# Patient Record
Sex: Male | Born: 1950 | Race: White | Hispanic: No | Marital: Married | Smoking: Former smoker
Health system: Southern US, Community
[De-identification: ages and names within clinical notes are randomized; demographics above are authoritative.]

## PROBLEM LIST (undated history)

## (undated) DIAGNOSIS — K219 Gastro-esophageal reflux disease without esophagitis: Secondary | ICD-10-CM

## (undated) DIAGNOSIS — J309 Allergic rhinitis, unspecified: Secondary | ICD-10-CM

## (undated) DIAGNOSIS — T8859XA Other complications of anesthesia, initial encounter: Secondary | ICD-10-CM

## (undated) DIAGNOSIS — E538 Deficiency of other specified B group vitamins: Secondary | ICD-10-CM

## (undated) DIAGNOSIS — I10 Essential (primary) hypertension: Secondary | ICD-10-CM

## (undated) DIAGNOSIS — M1990 Unspecified osteoarthritis, unspecified site: Secondary | ICD-10-CM

## (undated) DIAGNOSIS — C801 Malignant (primary) neoplasm, unspecified: Secondary | ICD-10-CM

## (undated) DIAGNOSIS — Z8719 Personal history of other diseases of the digestive system: Secondary | ICD-10-CM

## (undated) DIAGNOSIS — E785 Hyperlipidemia, unspecified: Secondary | ICD-10-CM

## (undated) DIAGNOSIS — T4145XA Adverse effect of unspecified anesthetic, initial encounter: Secondary | ICD-10-CM

## (undated) HISTORY — PX: COLONOSCOPY: SHX174

---
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
## 2006-08-30 ENCOUNTER — Ambulatory Visit: Payer: Self-pay | Admitting: Gastroenterology

## 2012-04-10 ENCOUNTER — Encounter: Payer: Self-pay | Admitting: Podiatry

## 2012-05-04 ENCOUNTER — Encounter: Payer: Self-pay | Admitting: Podiatry

## 2016-07-15 DIAGNOSIS — J111 Influenza due to unidentified influenza virus with other respiratory manifestations: Secondary | ICD-10-CM | POA: Diagnosis not present

## 2016-07-18 DIAGNOSIS — J40 Bronchitis, not specified as acute or chronic: Secondary | ICD-10-CM | POA: Diagnosis not present

## 2016-07-18 DIAGNOSIS — J0100 Acute maxillary sinusitis, unspecified: Secondary | ICD-10-CM | POA: Diagnosis not present

## 2016-10-08 DIAGNOSIS — Z0000 Encounter for general adult medical examination without abnormal findings: Secondary | ICD-10-CM | POA: Diagnosis not present

## 2016-10-12 DIAGNOSIS — C44519 Basal cell carcinoma of skin of other part of trunk: Secondary | ICD-10-CM | POA: Diagnosis not present

## 2016-10-12 DIAGNOSIS — Z85828 Personal history of other malignant neoplasm of skin: Secondary | ICD-10-CM | POA: Diagnosis not present

## 2016-10-12 DIAGNOSIS — L570 Actinic keratosis: Secondary | ICD-10-CM | POA: Diagnosis not present

## 2016-10-12 DIAGNOSIS — I788 Other diseases of capillaries: Secondary | ICD-10-CM | POA: Diagnosis not present

## 2016-10-12 DIAGNOSIS — L739 Follicular disorder, unspecified: Secondary | ICD-10-CM | POA: Diagnosis not present

## 2016-10-15 DIAGNOSIS — Z0000 Encounter for general adult medical examination without abnormal findings: Secondary | ICD-10-CM | POA: Diagnosis not present

## 2016-10-15 DIAGNOSIS — E782 Mixed hyperlipidemia: Secondary | ICD-10-CM | POA: Diagnosis not present

## 2016-10-15 DIAGNOSIS — E538 Deficiency of other specified B group vitamins: Secondary | ICD-10-CM | POA: Diagnosis not present

## 2016-10-15 DIAGNOSIS — Z125 Encounter for screening for malignant neoplasm of prostate: Secondary | ICD-10-CM | POA: Diagnosis not present

## 2016-10-15 DIAGNOSIS — M76899 Other specified enthesopathies of unspecified lower limb, excluding foot: Secondary | ICD-10-CM | POA: Diagnosis not present

## 2016-10-15 DIAGNOSIS — Z79899 Other long term (current) drug therapy: Secondary | ICD-10-CM | POA: Diagnosis not present

## 2016-11-05 DIAGNOSIS — Y92009 Unspecified place in unspecified non-institutional (private) residence as the place of occurrence of the external cause: Secondary | ICD-10-CM | POA: Diagnosis not present

## 2016-11-05 DIAGNOSIS — S61411A Laceration without foreign body of right hand, initial encounter: Secondary | ICD-10-CM | POA: Diagnosis not present

## 2016-11-05 DIAGNOSIS — X58XXXA Exposure to other specified factors, initial encounter: Secondary | ICD-10-CM | POA: Diagnosis not present

## 2016-11-05 DIAGNOSIS — S6981XA Other specified injuries of right wrist, hand and finger(s), initial encounter: Secondary | ICD-10-CM | POA: Diagnosis not present

## 2016-11-13 DIAGNOSIS — M25561 Pain in right knee: Secondary | ICD-10-CM | POA: Diagnosis not present

## 2016-11-13 DIAGNOSIS — M1711 Unilateral primary osteoarthritis, right knee: Secondary | ICD-10-CM | POA: Diagnosis not present

## 2016-11-15 DIAGNOSIS — I10 Essential (primary) hypertension: Secondary | ICD-10-CM | POA: Diagnosis not present

## 2016-11-15 DIAGNOSIS — T783XXA Angioneurotic edema, initial encounter: Secondary | ICD-10-CM | POA: Diagnosis not present

## 2016-11-18 ENCOUNTER — Observation Stay
Admission: EM | Admit: 2016-11-18 | Discharge: 2016-11-19 | Disposition: A | Discharge status: Home / Self Care | Payer: PPO | Attending: Specialist | Admitting: Specialist

## 2016-11-18 ENCOUNTER — Emergency Department: Payer: PPO

## 2016-11-18 DIAGNOSIS — E871 Hypo-osmolality and hyponatremia: Secondary | ICD-10-CM | POA: Diagnosis not present

## 2016-11-18 DIAGNOSIS — Z888 Allergy status to other drugs, medicaments and biological substances status: Secondary | ICD-10-CM | POA: Diagnosis not present

## 2016-11-18 DIAGNOSIS — R079 Chest pain, unspecified: Principal | ICD-10-CM | POA: Insufficient documentation

## 2016-11-18 DIAGNOSIS — Z79899 Other long term (current) drug therapy: Secondary | ICD-10-CM | POA: Diagnosis not present

## 2016-11-18 DIAGNOSIS — I10 Essential (primary) hypertension: Secondary | ICD-10-CM | POA: Insufficient documentation

## 2016-11-18 DIAGNOSIS — Z880 Allergy status to penicillin: Secondary | ICD-10-CM | POA: Diagnosis not present

## 2016-11-18 DIAGNOSIS — M79602 Pain in left arm: Secondary | ICD-10-CM | POA: Insufficient documentation

## 2016-11-18 DIAGNOSIS — E785 Hyperlipidemia, unspecified: Secondary | ICD-10-CM | POA: Diagnosis not present

## 2016-11-18 DIAGNOSIS — R0789 Other chest pain: Secondary | ICD-10-CM

## 2016-11-18 DIAGNOSIS — I200 Unstable angina: Secondary | ICD-10-CM | POA: Diagnosis not present

## 2016-11-18 DIAGNOSIS — R030 Elevated blood-pressure reading, without diagnosis of hypertension: Secondary | ICD-10-CM | POA: Diagnosis not present

## 2016-11-18 DIAGNOSIS — K219 Gastro-esophageal reflux disease without esophagitis: Secondary | ICD-10-CM | POA: Diagnosis not present

## 2016-11-18 HISTORY — DX: Hyperlipidemia, unspecified: E78.5

## 2016-11-18 HISTORY — DX: Essential (primary) hypertension: I10

## 2016-11-18 LAB — BASIC METABOLIC PANEL
ANION GAP: 7 (ref 5–15)
BUN: 14 mg/dL (ref 6–20)
CALCIUM: 9.5 mg/dL (ref 8.9–10.3)
CO2: 24 mmol/L (ref 22–32)
Chloride: 101 mmol/L (ref 101–111)
Creatinine, Ser: 0.76 mg/dL (ref 0.61–1.24)
GFR calc Af Amer: >60 mL/min (ref >60)
GLUCOSE: 120 mg/dL — AB (ref 65–99)
POTASSIUM: 3.8 mmol/L (ref 3.5–5.1)
SODIUM: 132 mmol/L — AB (ref 135–145)

## 2016-11-18 LAB — HEPARIN LEVEL (UNFRACTIONATED): HEPARIN UNFRACTIONATED: 0.35 [IU]/mL (ref 0.30–0.70)

## 2016-11-18 LAB — PROTIME-INR
INR: 1
PROTHROMBIN TIME: 13.2 s (ref 11.4–15.2)

## 2016-11-18 LAB — CBC WITH DIFFERENTIAL/PLATELET
BASOS ABS: 0 10*3/uL (ref 0–0.1)
Basophils Relative: 1 %
EOS PCT: 2 %
Eosinophils Absolute: 0.1 10*3/uL (ref 0–0.7)
HCT: 41 % (ref 40.0–52.0)
Hemoglobin: 14 g/dL (ref 13.0–18.0)
Lymphocytes Relative: 26 %
Lymphs Abs: 1.2 10*3/uL (ref 1.0–3.6)
MCH: 31 pg (ref 26.0–34.0)
MCHC: 34.2 g/dL (ref 32.0–36.0)
MCV: 90.6 fL (ref 80.0–100.0)
MONO ABS: 0.6 10*3/uL (ref 0.2–1.0)
Monocytes Relative: 13 %
Neutro Abs: 2.7 10*3/uL (ref 1.4–6.5)
Neutrophils Relative %: 58 %
PLATELETS: 265 10*3/uL (ref 150–440)
RBC: 4.53 MIL/uL (ref 4.40–5.90)
RDW: 13.3 % (ref 11.5–14.5)
WBC: 4.6 10*3/uL (ref 3.8–10.6)

## 2016-11-18 LAB — APTT: aPTT: 26 seconds (ref 24–36)

## 2016-11-18 LAB — TROPONIN I

## 2016-11-18 MED ORDER — ONDANSETRON HCL 4 MG/2ML IJ SOLN: 4.0000 mg | Freq: Four times a day (QID) | INTRAMUSCULAR | Status: DC | PRN

## 2016-11-18 MED ORDER — SIMVASTATIN 20 MG PO TABS: 40.0000 mg | ORAL_TABLET | Freq: Every day | ORAL | Status: DC

## 2016-11-18 MED ORDER — ACETAMINOPHEN 325 MG PO TABS
650.0000 mg | ORAL_TABLET | ORAL | Status: DC | PRN
  Administered 2016-11-18 (×2): 650 mg via ORAL
  Filled 2016-11-18 (×2): qty 2

## 2016-11-18 MED ORDER — METOPROLOL TARTRATE 25 MG PO TABS
25.0000 mg | ORAL_TABLET | Freq: Once | ORAL | Status: AC
  Administered 2016-11-18: 25 mg via ORAL
  Filled 2016-11-18: qty 1

## 2016-11-18 MED ORDER — ASPIRIN 325 MG PO TABS
325.0000 mg | ORAL_TABLET | Freq: Every day | ORAL | Status: DC
  Filled 2016-11-18: qty 1

## 2016-11-18 MED ORDER — PANTOPRAZOLE SODIUM 40 MG PO TBEC
40.0000 mg | DELAYED_RELEASE_TABLET | Freq: Every day | ORAL | Status: DC
  Administered 2016-11-19: 40 mg via ORAL
  Filled 2016-11-18: qty 1

## 2016-11-18 MED ORDER — AMLODIPINE BESYLATE 5 MG PO TABS
5.0000 mg | ORAL_TABLET | Freq: Once | ORAL | Status: AC
  Administered 2016-11-18: 5 mg via ORAL
  Filled 2016-11-18: qty 1

## 2016-11-18 MED ORDER — NITROGLYCERIN 2 % TD OINT
1.0000 [in_us] | TOPICAL_OINTMENT | Freq: Once | TRANSDERMAL | Status: AC
  Administered 2016-11-18: 1 [in_us] via TOPICAL
  Filled 2016-11-18: qty 1

## 2016-11-18 MED ORDER — NITROGLYCERIN 0.4 MG SL SUBL
0.4000 mg | SUBLINGUAL_TABLET | SUBLINGUAL | Status: DC | PRN
  Administered 2016-11-18: 0.4 mg via SUBLINGUAL
  Filled 2016-11-18: qty 1

## 2016-11-18 MED ORDER — SODIUM CHLORIDE 0.9 % IV SOLN
INTRAVENOUS | Status: AC
  Administered 2016-11-18 – 2016-11-19 (×2): via INTRAVENOUS

## 2016-11-18 MED ORDER — HEPARIN (PORCINE) IN NACL 100-0.45 UNIT/ML-% IJ SOLN
1000.0000 [IU]/h | INTRAMUSCULAR | Status: DC
  Administered 2016-11-18: 1000 [IU]/h via INTRAVENOUS
  Filled 2016-11-18 (×2): qty 250

## 2016-11-18 MED ORDER — DOXAZOSIN MESYLATE 2 MG PO TABS
2.0000 mg | ORAL_TABLET | Freq: Every day | ORAL | Status: DC
  Filled 2016-11-18: qty 1

## 2016-11-18 MED ORDER — MORPHINE SULFATE (PF) 4 MG/ML IV SOLN
4.0000 mg | INTRAVENOUS | Status: DC | PRN
  Administered 2016-11-18: 4 mg via INTRAVENOUS
  Filled 2016-11-18: qty 1

## 2016-11-18 MED ORDER — AMLODIPINE BESYLATE 5 MG PO TABS: 5.0000 mg | ORAL_TABLET | Freq: Every day | ORAL | Status: DC

## 2016-11-18 MED ORDER — HEPARIN BOLUS VIA INFUSION
4000.0000 [IU] | Freq: Once | INTRAVENOUS | Status: AC
  Administered 2016-11-18: 4000 [IU] via INTRAVENOUS
  Filled 2016-11-18: qty 4000

## 2016-11-18 MED ORDER — ACETAMINOPHEN 325 MG PO TABS
650.0000 mg | ORAL_TABLET | Freq: Once | ORAL | Status: AC
  Administered 2016-11-18: 650 mg via ORAL
  Filled 2016-11-18: qty 2

## 2016-11-18 MED ORDER — METOPROLOL TARTRATE 25 MG PO TABS
12.5000 mg | ORAL_TABLET | Freq: Two times a day (BID) | ORAL | Status: DC
  Administered 2016-11-18: 12.5 mg via ORAL
  Filled 2016-11-18: qty 1

## 2016-11-18 MED ORDER — ASPIRIN 81 MG PO CHEW
324.0000 mg | CHEWABLE_TABLET | Freq: Once | ORAL | Status: AC
  Administered 2016-11-18: 324 mg via ORAL
  Filled 2016-11-18: qty 4

## 2016-11-18 NOTE — ED Provider Notes (Signed)
[facility] Emergency Department Provider Note    None    (approximate)  I have reviewed the triage vital signs and the nursing notes.   HISTORY  Chief Complaint Elevated BP, left arm discomfort   HPI Bryan Santiago is a 66 y.o. male with a history of hyperlipidemia and hypertension presents with left arm discomfort and flushing sensation on his neck. Patient woke up this morning and checked his blood pressure which was elevated significantly symptoms are related to elevated blood pressure. He denies any chest pain or shortness of breath. No lower extremity swelling. No diaphoresis. No nausea. Denies any true numbness or tingling with states that he does have this heavy feeling in his left arm that does not change with exertion or position area and no recent fevers. Did recently have a small case of angioedema for which they took him off of his benazapril.  Was started on doxazosin and took it for the first time last night. Woke up with these sensations this morning. States is also gone under quite a bit of stress at home and at work.   Past Medical History:  Diagnosis Date  . Hyperlipemia   . Hypertension    [place]: cardiac disease PSH:no recent surgeries There are no active problems to display for this patient.     Prior to Admission medications   Not on File    Allergies Ace inhibitors; Penicillins; and Quinolones    Social History Social History  Substance Use Topics  . Smoking status: Not on file  . Smokeless tobacco: Not on file  . Alcohol use Not on file    Review of Systems Patient denies headaches, rhinorrhea, blurry vision, numbness, shortness of breath, chest pain, edema, cough, abdominal pain, nausea, vomiting, diarrhea, dysuria, fevers, rashes or hallucinations unless otherwise stated above in HPI. ____________________________________________   PHYSICAL EXAM:  VITAL SIGNS: Vitals:   11/18/16 1130 11/18/16 1140  BP: (!)  173/107 (!) 153/97  Pulse: 92 (!) 110  Resp: 18 19  Temp:      Constitutional: Alert and oriented. Well appearing and in no acute distress. Eyes: Conjunctivae are normal.  Head: Atraumatic. Nose: No congestion/rhinnorhea. Mouth/Throat: Mucous membranes are moist.   Neck: No stridor. Painless ROM.  Cardiovascular: Normal rate, regular rhythm. Grossly normal heart sounds.  Good peripheral circulation. Respiratory: Normal respiratory effort.  No retractions. Lungs CTAB. Gastrointestinal: Soft and nontender. No distention. No abdominal bruits. No CVA tenderness. Musculoskeletal: No lower extremity tenderness nor edema.  No joint effusions. Neurologic:  Normal speech and language. No gross focal neurologic deficits are appreciated. No facial droop Skin:  Skin is warm, dry and intact. No rash noted. Psychiatric: Mood and affect are normal. Speech and behavior are normal.  ____________________________________________   LABS (all labs ordered are listed, but only abnormal results are displayed)  Results for orders placed or performed during the hospital encounter of 11/18/16 (from the past 24 hour(s))  CBC with Differential/Platelet     Status: None   Collection Time: 11/18/16 10:41 AM  Result Value Ref Range   WBC 4.6 3.8 - 10.6 K/uL   RBC 4.53 4.40 - 5.90 MIL/uL   Hemoglobin 14.0 13.0 - 18.0 g/dL   HCT 41.0 40.0 - 52.0 %   MCV 90.6 80.0 - 100.0 fL   MCH 31.0 26.0 - 34.0 pg   MCHC 34.2 32.0 - 36.0 g/dL   RDW 13.3 11.5 - 14.5 %   Platelets 265 150 - 440 K/uL  Neutrophils Relative % 58 %   Neutro Abs 2.7 1.4 - 6.5 K/uL   Lymphocytes Relative 26 %   Lymphs Abs 1.2 1.0 - 3.6 K/uL   Monocytes Relative 13 %   Monocytes Absolute 0.6 0.2 - 1.0 K/uL   Eosinophils Relative 2 %   Eosinophils Absolute 0.1 0 - 0.7 K/uL   Basophils Relative 1 %   Basophils Absolute 0.0 0 - 0.1 K/uL  Basic metabolic panel     Status: Abnormal   Collection Time: 11/18/16 10:41 AM  Result Value Ref Range     Sodium 132 (L) 135 - 145 mmol/L   Potassium 3.8 3.5 - 5.1 mmol/L   Chloride 101 101 - 111 mmol/L   CO2 24 22 - 32 mmol/L   Glucose, Bld 120 (H) 65 - 99 mg/dL   BUN 14 6 - 20 mg/dL   Creatinine, Ser 0.76 0.61 - 1.24 mg/dL   Calcium 9.5 8.9 - 10.3 mg/dL   GFR calc non Af Amer >60 >60 mL/min   GFR calc Af Amer >60 >60 mL/min   Anion gap 7 5 - 15  Troponin I     Status: None   Collection Time: 11/18/16 10:41 AM  Result Value Ref Range   Troponin I <0.03 <0.03 ng/mL   ____________________________________________  EKG My review and personal interpretation at Time: 10:32   Indication: htn  Rate: 85  Rhythm: sinus Axis: normal Other: < 48mm st depressions, normal intervals ____________________________________________  RADIOLOGY  I personally reviewed all radiographic images ordered to evaluate for the above acute complaints and reviewed radiology reports and findings.  These findings were personally discussed with the patient.  Please see medical record for radiology report.  ____________________________________________   PROCEDURES  Procedure(s) performed:  Procedures    Critical Care performed:yes CRITICAL CARE Performed by: Merlyn Lot   Total critical care time: 30 minutes  Critical care time was exclusive of separately billable procedures and treating other patients.  Critical care was necessary to treat or prevent imminent or life-threatening deterioration.  Critical care was time spent personally by me on the following activities: development of treatment plan with patient and/or surrogate as well as nursing, discussions with consultants, evaluation of patient's response to treatment, examination of patient, obtaining history from patient or surrogate, ordering and performing treatments and interventions, ordering and review of laboratory studies, ordering and review of radiographic studies, pulse oximetry and re-evaluation of patient's  condition.  ____________________________________________   INITIAL IMPRESSION / ASSESSMENT AND PLAN / ED COURSE  Pertinent labs & imaging results that were available during my care of the patient were reviewed by me and considered in my medical decision making (see chart for details).  DDX: ACS, htn emergency, htnive urgency, dissection, pna, bronchitis, stress   Bryan Santiago is a 66 y.o. who presents to the ED with Left arm and neck pain as described above. Patient hypertensive in the ER but fairly well-appearing. EKG shows nonspecific but concerning ST depressions in anteroseptal distribution. ABR is also mildly elevated as compared to V1. Do not feel this meets STEMI criteria but is concerning given his symptoms. He has a heart score of 4. He has not had any previous cardiac workup. Will evaluate for any evidence of hypertensive urgency. Seems less consistent with dissection and physical pulses throughout and chest x-ray shows no mediastinal widening.  The patient will be placed on continuous pulse oximetry and telemetry for monitoring.  Laboratory evaluation will be sent to evaluate for the  above complaints.     Clinical Course as of Nov 18 1212  Sun Nov 18, 2016  1204 Patient reassessed. States he did have improvement in his neck pain with nitroglycerin. This is also having some persistent left arm pain. Initial troponin is negative. No evidence of mediastinal widening to suggest dissection.  [PR]    Clinical Course User Index [PR] Merlyn Lot, MD    Discussion with the hospitalist and cardiology will continue patient on heparin for concern for unstable angina.  Have discussed with the patient and available family all diagnostics and treatments performed thus far and all questions were answered to the best of my ability. The patient demonstrates understanding and agreement with plan.  ____________________________________________   FINAL CLINICAL IMPRESSION(S) / ED  DIAGNOSES  Final diagnoses:  Atypical chest pain  Arm pain, central, left      NEW MEDICATIONS STARTED DURING THIS VISIT:  New Prescriptions   No medications on file     Note:  This document was prepared using Dragon voice recognition software and may include unintentional dictation errors.    Merlyn Lot, MD 11/18/16 1318

## 2016-11-18 NOTE — Progress Notes (Signed)
ANTICOAGULATION CONSULT NOTE - Initial Consult  Pharmacy Consult for Heparin Drip  Indication: chest pain/ACS  Allergies  Allergen Reactions  . Ace Inhibitors Swelling    angioedema  . Mucinex [Guaifenesin Er]     Facial swelling   . Penicillins Swelling    Has patient had a PCN reaction causing immediate rash, facial/tongue/throat swelling, SOB or lightheadedness with hypotension: Yes Has patient had a PCN reaction causing severe rash involving mucus membranes or skin necrosis: No Has patient had a PCN reaction that required hospitalization: No Has patient had a PCN reaction occurring within the last 10 years: Yes If all of the above answers are "NO", then may proceed with Cephalosporin use.  Happened 06/2016. Facial swelling   . Quinolones     Pt states he doesn't have any recollection of this being an allergy     Patient Measurements: Height: 5\' 10"  (177.8 cm) Weight: 190 lb (86.2 kg) IBW/kg (Calculated) : 73  Vital Signs: Temp: 98 F (36.7 C) (06/17 1028) Temp Source: Oral (06/17 1028) BP: 152/93 (06/17 1230) Pulse Rate: 82 (06/17 1230)  Labs:  Recent Labs  11/18/16 1041  HGB 14.0  HCT 41.0  PLT 265  CREATININE 0.76  TROPONINI <0.03    Estimated Creatinine Clearance: 95.1 mL/min (by C-G formula based on SCr of 0.76 mg/dL).   Medical History: Past Medical History:  Diagnosis Date  . Hyperlipemia   . Hypertension    Assessment: 66 yo male with left arm and jaw pain. Pharmacy consulted for heparin drip dosing and monitoring for ACS.   Goal of Therapy:  Heparin level 0.3-0.7 units/ml Monitor platelets by anticoagulation protocol: Yes   Plan:  Baseline labs ordered Give 4000 units bolus x 1 Start heparin infusion at 1000 units/hr Check anti-Xa level in 6 hours and daily while on heparin Continue to monitor H&H and platelets  Pernell Dupre, PharmD, BCPS Clinical Pharmacist 11/18/2016 12:42 PM

## 2016-11-18 NOTE — Care Management Obs Status (Signed)
[facility] NOTIFICATION   Patient Details  Name: Bryan Santiago MRN: [id number] Date of Birth: [birth year]   Medicare Observation Status Notification Given:  Yes (unstable angina)    Ival Bible, RN 11/18/2016, 3:03 PM

## 2016-11-18 NOTE — H&P (Signed)
[facility] NAME: Bryan Santiago    MR#:  [id number]  DATE OF BIRTH:  [birth year]  DATE OF ADMISSION:  11/18/2016  PRIMARY CARE PHYSICIAN: System, Pcp Not In   REQUESTING/REFERRING PHYSICIAN: Quentin Cornwall  CHIEF COMPLAINT:   Jaw pain and left shoulder pain with elevated blood pressure HISTORY OF PRESENT ILLNESS:  Bryan Santiago  is a 66 y.o. male with a known history ofHypertension and hyperlipidemia is presenting to the ED with a chief complaint of pressure in the neck radiating to the left jaw and also left shoulder pain started at around 9:00 in the morning. Patient came into the ED as the pressure is not getting better. No similar complaints in the past. EKG has revealed ST depressions from v2-v4. Initial troponin is negative. Patient's jaw pain and shoulder pain slightly better after giving sublingual nitroglycerin. Nitropaste attached to the anterior chest wall. Patient is started on heparin drip Chest x-rays negative and the hospitalist team is called to admit the patient  PAST MEDICAL HISTORY:   Past Medical History:  Diagnosis Date  . Hyperlipemia   . Hypertension     PAST SURGICAL HISTOIRY:  No past surgical history on file.  SOCIAL HISTORY:   Social History  Substance Use Topics  . Smoking status: Not on file  . Smokeless tobacco: Not on file  . Alcohol use Not on file    FAMILY HISTORY:  No family history on file.  DRUG ALLERGIES:   Allergies  Allergen Reactions  . Ace Inhibitors Swelling    angioedema  . Mucinex [Guaifenesin Er]     Facial swelling   . Penicillins Swelling    Has patient had a PCN reaction causing immediate rash, facial/tongue/throat swelling, SOB or lightheadedness with hypotension: Yes Has patient had a PCN reaction causing severe rash involving mucus membranes or skin necrosis: No Has patient had a PCN reaction that required hospitalization: No Has patient had a PCN reaction occurring  within the last 10 years: Yes If all of the above answers are "NO", then may proceed with Cephalosporin use.  Happened 06/2016. Facial swelling   . Quinolones     Pt states he doesn't have any recollection of this being an allergy     REVIEW OF SYSTEMS:  CONSTITUTIONAL: No fever, fatigue or weakness.  EYES: No blurred or double vision.  EARS, NOSE, AND THROAT: No tinnitus or ear pain.  RESPIRATORY: No cough, shortness of breath, wheezing or hemoptysis.  CARDIOVASCULAR: Neck pressure and left shoulder pain with jaw pain No chest pain, orthopnea, edema.  GASTROINTESTINAL: No nausea, vomiting, diarrhea or abdominal pain.  GENITOURINARY: No dysuria, hematuria.  ENDOCRINE: No polyuria, nocturia,  HEMATOLOGY: No anemia, easy bruising or bleeding SKIN: No rash or lesion. MUSCULOSKELETAL: No joint pain or arthritis.   NEUROLOGIC: No tingling, numbness, weakness.  PSYCHIATRY: No anxiety or depression.   MEDICATIONS AT HOME:   Prior to Admission medications   Medication Sig Start Date End Date Taking? Authorizing Provider  amLODipine (NORVASC) 5 MG tablet Take 5 mg by mouth daily. 11/15/16  Yes [provider]  doxazosin (CARDURA) 2 MG tablet Take 2 mg by mouth daily. 11/15/16  Yes [provider]  omeprazole (PRILOSEC) 20 MG capsule Take 20 mg by mouth daily. 09/09/16  Yes [provider]  simvastatin (ZOCOR) 40 MG tablet Take 40 mg by mouth daily. 09/12/16  Yes [provider]      VITAL SIGNS:  Blood pressure Marland Kitchen)  152/93, pulse 82, temperature 98 F (36.7 C), temperature source Oral, resp. rate 19, height 5\' 10"  (1.778 m), weight 86.2 kg (190 lb), SpO2 96 %.  PHYSICAL EXAMINATION:  GENERAL:  66 y.o.-year-old patient lying in the bed with no acute distress.  EYES: Pupils equal, round, reactive to light and accommodation. No scleral icterus. Extraocular muscles intact.  HEENT: Head atraumatic, normocephalic. Oropharynx and nasopharynx clear.  NECK:   Supple, no jugular venous distention. No thyroid enlargement, no tenderness.  LUNGS: Normal breath sounds bilaterally, no wheezing, rales,rhonchi or crepitation. No use of accessory muscles of respiration.  CARDIOVASCULAR: S1, S2 normal. No murmurs, rubs, or gallops.  ABDOMEN: Soft, nontender, nondistended. Bowel sounds present. No organomegaly or mass.  EXTREMITIES: No pedal edema, cyanosis, or clubbing.  NEUROLOGIC: Cranial nerves II through XII are intact. Muscle strength 5/5 in all extremities. Sensation intact. Gait not checked.  PSYCHIATRIC: The patient is alert and oriented x 3. Anxious SKIN: No obvious rash, lesion, or ulcer.   LABORATORY PANEL:   CBC  Recent Labs Lab 11/18/16 1041  WBC 4.6  HGB 14.0  HCT 41.0  PLT 265   ------------------------------------------------------------------------------------------------------------------  Chemistries   Recent Labs Lab 11/18/16 1041  NA 132*  K 3.8  CL 101  CO2 24  GLUCOSE 120*  BUN 14  CREATININE 0.76  CALCIUM 9.5   ------------------------------------------------------------------------------------------------------------------  Cardiac Enzymes  Recent Labs Lab 11/18/16 1041  TROPONINI <0.03   ------------------------------------------------------------------------------------------------------------------  RADIOLOGY:  Dg Chest Portable 1 View  Result Date: 11/18/2016 CLINICAL DATA:  Left arm and jaw pain.  Elevated blood pressure. EXAM: PORTABLE CHEST 1 VIEW COMPARISON:  None. FINDINGS: The heart size and mediastinal contours are within normal limits. Both lungs are clear. The visualized skeletal structures are unremarkable. IMPRESSION: No active disease. Electronically Signed   By: Dorise Bullion III M.D   On: 11/18/2016 11:57    EKG:   Orders placed or performed during the hospital encounter of 11/18/16  . EKG 12-Lead  . EKG 12-Lead    IMPRESSION AND PLAN:  Bryan Santiago  is a 66 y.o. male with a  known history ofHypertension and hyperlipidemia is presenting to the ED with a chief complaint of pressure in the neck radiating to the left jaw and also left shoulder pain started at around 9:00 in the morning. Patient came into the ED as the pressure is not getting better. No similar complaints in the past. EKG has revealed ST depressions from v2-v4. Initial troponin is negative.  # Unstable angina Tele heaprin gtt EKG reveals ST depressions from V2-4 Aspirin, nitroglycerin, beta blocker and statin Cardio consult  Will get echocardiogram Nitro paste and anterior chest wall Nothing by mouth after midnight for possible Myoview test if troponins are not trending  #Essential hypertension-blood pressure is elevated Metoprolol is added to the regimen and titrate as needed Nitropaste is attached to the anterior chest wall Continue home medications amlodipine and doxazosin  #Hyperlipidemia Check fasting lipid panel and continue statin  #Hyponatremia IV fluids, mentating fine, recheck labs in a.m.     All the records are reviewed and case discussed with ED provider. Management plans discussed with the patient, family and they are in agreement.  CODE STATUS: fc, wife HCPOA  TOTAL TIME TAKING CARE OF THIS PATIENT: 45 minutes.   Note: This dictation was prepared with Dragon dictation along with smaller phrase technology. Any transcriptional errors that result from this process are unintentional.  Nicholes Mango M.D on 11/18/2016 at 1:19 PM  Between  7am to 6pm - Pager - [phone]  After 6pm go to [email or web address] - password [email or web address] [facility]  Office  [phone]  CC: Primary care physician; System, Pcp Not In

## 2016-11-18 NOTE — Progress Notes (Addendum)
ANTICOAGULATION CONSULT NOTE - Initial Consult  Pharmacy Consult for Heparin Drip  Indication: chest pain/ACS  Allergies  Allergen Reactions  . Ace Inhibitors Swelling    angioedema  . Mucinex [Guaifenesin Er]     Facial swelling   . Penicillins Swelling    Has patient had a PCN reaction causing immediate rash, facial/tongue/throat swelling, SOB or lightheadedness with hypotension: Yes Has patient had a PCN reaction causing severe rash involving mucus membranes or skin necrosis: No Has patient had a PCN reaction that required hospitalization: No Has patient had a PCN reaction occurring within the last 10 years: Yes If all of the above answers are "NO", then may proceed with Cephalosporin use.  Happened 06/2016. Facial swelling   . Quinolones     Pt states he doesn't have any recollection of this being an allergy     Patient Measurements: Height: 5\' 10"  (177.8 cm) Weight: 187 lb 8 oz (85 kg) IBW/kg (Calculated) : 73  Vital Signs: Temp: 97.7 F (36.5 C) (06/17 2005) Temp Source: Oral (06/17 2005) BP: 135/76 (06/17 2005) Pulse Rate: 67 (06/17 2005)  Labs:  Recent Labs  11/18/16 1041 11/18/16 1241 11/18/16 1930  HGB 14.0  --   --   HCT 41.0  --   --   PLT 265  --   --   APTT  --  26  --   LABPROT  --  13.2  --   INR  --  1.00  --   HEPARINUNFRC  --   --  0.35  CREATININE 0.76  --   --   TROPONINI <0.03  --   --     Estimated Creatinine Clearance: 95.1 mL/min (by C-G formula based on SCr of 0.76 mg/dL).   Medical History: Past Medical History:  Diagnosis Date  . Hyperlipemia   . Hypertension    Assessment: 66 yo male with left arm and jaw pain. Pharmacy consulted for heparin drip dosing and monitoring for ACS.   Goal of Therapy:  Heparin level 0.3-0.7 units/ml Monitor platelets by anticoagulation protocol: Yes   Plan:   0617 1930 HL: .0.35. Level is therapeutic. Will continue heparin drip at 1000units/hr and Will redraw level in 6 hours. CBC with AM  labs per protocol.   Pernell Dupre, PharmD, BCPS Clinical Pharmacist 11/18/2016 8:28 PM    6/18 01:30 heparin level 0.33. Continue current regimen. Recheck heparin level and CBC with tomorrow AM labs.  Sim Boast, PharmD, BCPS  11/19/16 2:11 AM

## 2016-11-18 NOTE — ED Triage Notes (Signed)
Pt states they changed his b/p and states he started the new one Friday, states today b/p is elevated 170/100's with left arm and jaw pain.Marland Kitchen

## 2016-11-19 ENCOUNTER — Observation Stay (HOSPITAL_BASED_OUTPATIENT_CLINIC_OR_DEPARTMENT_OTHER)
Admit: 2016-11-19 | Discharge: 2016-11-19 | Disposition: A | Discharge status: Home / Self Care | Payer: PPO | Attending: Internal Medicine | Admitting: Internal Medicine

## 2016-11-19 ENCOUNTER — Observation Stay (HOSPITAL_BASED_OUTPATIENT_CLINIC_OR_DEPARTMENT_OTHER): Payer: PPO

## 2016-11-19 ENCOUNTER — Encounter: Payer: Self-pay | Admitting: Physician Assistant

## 2016-11-19 DIAGNOSIS — I360 Nonrheumatic tricuspid (valve) stenosis: Secondary | ICD-10-CM | POA: Diagnosis not present

## 2016-11-19 DIAGNOSIS — M79602 Pain in left arm: Secondary | ICD-10-CM

## 2016-11-19 DIAGNOSIS — E782 Mixed hyperlipidemia: Secondary | ICD-10-CM | POA: Diagnosis not present

## 2016-11-19 DIAGNOSIS — E785 Hyperlipidemia, unspecified: Secondary | ICD-10-CM | POA: Diagnosis not present

## 2016-11-19 DIAGNOSIS — R079 Chest pain, unspecified: Secondary | ICD-10-CM | POA: Diagnosis not present

## 2016-11-19 DIAGNOSIS — E871 Hypo-osmolality and hyponatremia: Secondary | ICD-10-CM | POA: Diagnosis not present

## 2016-11-19 DIAGNOSIS — I10 Essential (primary) hypertension: Secondary | ICD-10-CM | POA: Diagnosis not present

## 2016-11-19 DIAGNOSIS — I200 Unstable angina: Secondary | ICD-10-CM | POA: Diagnosis not present

## 2016-11-19 LAB — NM MYOCAR MULTI W/SPECT W/WALL MOTION / EF
CSEPEDS: 0 s
CSEPEW: 7 METS
CSEPHR: 87 %
Exercise duration (min): 6 min
LV sys vol: 30 mL
LVDIAVOL: 90 mL (ref 62–150)
Peak HR: 136 {beats}/min
Rest HR: 67 {beats}/min
SDS: 0
SRS: 2
SSS: 4
TID: 0.92

## 2016-11-19 LAB — BASIC METABOLIC PANEL
Anion gap: 5 (ref 5–15)
BUN: 13 mg/dL (ref 6–20)
CALCIUM: 8.9 mg/dL (ref 8.9–10.3)
CO2: 25 mmol/L (ref 22–32)
Chloride: 107 mmol/L (ref 101–111)
Creatinine, Ser: 0.86 mg/dL (ref 0.61–1.24)
GLUCOSE: 119 mg/dL — AB (ref 65–99)
Potassium: 4 mmol/L (ref 3.5–5.1)
SODIUM: 137 mmol/L (ref 135–145)

## 2016-11-19 LAB — CBC
HCT: 36.7 % — ABNORMAL LOW (ref 40.0–52.0)
Hemoglobin: 12.8 g/dL — ABNORMAL LOW (ref 13.0–18.0)
MCH: 31.4 pg (ref 26.0–34.0)
MCHC: 34.9 g/dL (ref 32.0–36.0)
MCV: 90.2 fL (ref 80.0–100.0)
PLATELETS: 257 10*3/uL (ref 150–440)
RBC: 4.07 MIL/uL — AB (ref 4.40–5.90)
RDW: 13.5 % (ref 11.5–14.5)
WBC: 5.3 10*3/uL (ref 3.8–10.6)

## 2016-11-19 LAB — LIPID PANEL
CHOL/HDL RATIO: 2.3 ratio
Cholesterol: 151 mg/dL (ref 0–200)
HDL: 66 mg/dL (ref >40)
LDL CALC: 73 mg/dL (ref 0–99)
Triglycerides: 58 mg/dL (ref <150)
VLDL: 12 mg/dL (ref 0–40)

## 2016-11-19 LAB — TROPONIN I: Troponin I: <0.03 ng/mL (ref <0.03)

## 2016-11-19 LAB — ECHOCARDIOGRAM COMPLETE
HEIGHTINCHES: 70 in
WEIGHTICAEL: 3016 [oz_av]

## 2016-11-19 LAB — HEPARIN LEVEL (UNFRACTIONATED): HEPARIN UNFRACTIONATED: 0.33 [IU]/mL (ref 0.30–0.70)

## 2016-11-19 MED ORDER — HYDROCHLOROTHIAZIDE 25 MG PO TABS: 25.0000 mg | ORAL_TABLET | Freq: Every day | ORAL | 1 refills | Status: DC

## 2016-11-19 MED ORDER — DOXAZOSIN MESYLATE 4 MG PO TABS: 4.0000 mg | ORAL_TABLET | Freq: Every day | ORAL | Status: DC

## 2016-11-19 MED ORDER — TECHNETIUM TC 99M TETROFOSMIN IV KIT
13.0000 | PACK | Freq: Once | INTRAVENOUS | Status: AC | PRN
Start: 2016-11-19 — End: 2016-11-19
  Administered 2016-11-19: 12.7 via INTRAVENOUS

## 2016-11-19 MED ORDER — HYDROCHLOROTHIAZIDE 25 MG PO TABS
25.0000 mg | ORAL_TABLET | Freq: Every day | ORAL | Status: DC
Start: 2016-11-19 — End: 2016-11-19
  Administered 2016-11-19: 25 mg via ORAL
  Filled 2016-11-19: qty 1

## 2016-11-19 MED ORDER — ASPIRIN EC 81 MG PO TBEC
81.0000 mg | DELAYED_RELEASE_TABLET | Freq: Every day | ORAL | Status: DC
  Administered 2016-11-19: 81 mg via ORAL
  Filled 2016-11-19: qty 1

## 2016-11-19 MED ORDER — AMLODIPINE BESYLATE 10 MG PO TABS: 10.0000 mg | ORAL_TABLET | Freq: Every day | ORAL | 1 refills | Status: DC

## 2016-11-19 MED ORDER — DOXAZOSIN MESYLATE 2 MG PO TABS
2.0000 mg | ORAL_TABLET | Freq: Every day | ORAL | Status: DC
  Administered 2016-11-19: 2 mg via ORAL
  Filled 2016-11-19: qty 1

## 2016-11-19 MED ORDER — TECHNETIUM TC 99M TETROFOSMIN IV KIT
29.4500 | PACK | Freq: Once | INTRAVENOUS | Status: AC | PRN
  Administered 2016-11-19: 29.45 via INTRAVENOUS

## 2016-11-19 MED ORDER — ATORVASTATIN CALCIUM 20 MG PO TABS: 40.0000 mg | ORAL_TABLET | Freq: Every day | ORAL | Status: DC

## 2016-11-19 MED ORDER — AMLODIPINE BESYLATE 10 MG PO TABS
10.0000 mg | ORAL_TABLET | Freq: Every day | ORAL | Status: DC
  Administered 2016-11-19: 10 mg via ORAL
  Filled 2016-11-19: qty 1

## 2016-11-19 NOTE — Consult Note (Signed)
Cardiology Consultation Note  Patient ID: Bryan Santiago, MRN: [id number], DOB/AGE: Jun 07, 1950 66 y.o. Admit date: 11/18/2016   Date of Consult: 11/19/2016 Primary Physician: System, Pcp Not In Primary Cardiologist: New to [facility] - consult by Rockey Situ Requesting Physician: Dr. Margaretmary Eddy, MD  Chief Complaint: Neck pain Reason for Consult: Same  HPI: Bryan Santiago is a 66 y.o. male who is being seen today for the evaluation of neck pain at the request of Dr. Margaretmary Eddy, MD. Patient has a h/o hypertension and hyperlipidemia who presented to [facility] ED on 6/17 with episode of neck pressure radiating to the left jaw as well as left shoulder that began around 9 AM on 6/17.  Patient does not have any previously known cardiac history. At baseline patient is very active, mowing his own lawn with a push mower and walks a couple of miles daily without issues. Has neve had chest pain, neck pain, shoulder pain, or SOb with exertion. The prior week he developed angioedema with lisinopril and was changed to Cardura. With this change, he noted some increases in his home BP from the 762U systolic to 633H systolic. On the morning of 6/17 around 9 AM he noted throat/neck pain that radiated to his left shoulder and down his left arm to his elbow. Pain was an ache and rated 4/10. Pain lasted ~ 2-3 hours and resolved at the [facility] ED with improvement in his BP. There was some associated palpitations, though no SOB, diaphoresis, nausea, vomiting, dizziness, presyncope, or syncope.   Upon his arrival to [facility] ED he was noted to have blood pressure of 188/92, heart rate 90 to bpm, temperature 98.52F, oxygen saturation 96% on room air, weight 190 pounds. EKG in the ED showed NSR, 84 bpm, probable left atrial enlargement, no acute ST-T changes, horizontal ST depression of 1 mm in lead V3 and V4. Follow-up EKG on 6/18 shows sinus bradycardia, 51 bpm, T-wave inversion in lead III, inferolateral Q waves. In the ED patient was given ASA 324 mg 1. Along  with amlodipine 5 mg, sublingual nitroglycerin, Nitropaste, and heparin infusion. Troponin has been cycle 1 and found to be less than 0.03, it does not appear troponins were continued to be cycled upon admission. Last troponin drawn at 10:41 AM on 6/17. Serum creatinine 0.76, potassium 3.8 trending to 4.0. Initial CBC unremarkable. LDL 73. Chest x-ray without active disease. Internal medicine ordered nuclear stress test along with cardiology consultation. Blood pressure has improved to 145/74 at time of cardiology consultation. Patient was noted to be bradycardic into the upper 40s overnight. Currently,   Past Medical History:  Diagnosis Date  . Hyperlipemia   . Hypertension       Most Recent Cardiac Studies: none   Surgical History: History reviewed. No pertinent surgical history.   Home Meds: Prior to Admission medications   Medication Sig Start Date End Date Taking? Authorizing Provider  amLODipine (NORVASC) 5 MG tablet Take 5 mg by mouth daily. 11/15/16  Yes [provider]  doxazosin (CARDURA) 2 MG tablet Take 2 mg by mouth daily. 11/15/16  Yes [provider]  omeprazole (PRILOSEC) 20 MG capsule Take 20 mg by mouth daily. 09/09/16  Yes [provider]  simvastatin (ZOCOR) 40 MG tablet Take 40 mg by mouth daily. 09/12/16  Yes [provider]    Inpatient Medications:  . amLODipine  5 mg Oral Daily  . aspirin  325 mg Oral Daily  . doxazosin  2 mg Oral Daily  . metoprolol  tartrate  12.5 mg Oral BID  . pantoprazole  40 mg Oral Daily  . simvastatin  40 mg Oral Daily   . sodium chloride 75 mL/hr at 11/19/16 0526  . heparin 1,000 Units/hr (11/18/16 1320)    Allergies:  Allergies  Allergen Reactions  . Ace Inhibitors Swelling    angioedema  . Mucinex [Guaifenesin Er]     Facial swelling   . Penicillins Swelling    Has patient had a PCN reaction causing immediate rash, facial/tongue/throat swelling, SOB or lightheadedness with hypotension:  Yes Has patient had a PCN reaction causing severe rash involving mucus membranes or skin necrosis: No Has patient had a PCN reaction that required hospitalization: No Has patient had a PCN reaction occurring within the last 10 years: Yes If all of the above answers are "NO", then may proceed with Cephalosporin use.  Happened 06/2016. Facial swelling   . Quinolones     Pt states he doesn't have any recollection of this being an allergy     Social History   Social History  . Marital status: Married    Spouse name: N/A  . Number of children: N/A  . Years of education: N/A   Occupational History  . Not on file.   Social History Main Topics  . Smoking status: Never Smoker  . Smokeless tobacco: Never Used  . Alcohol use Not on file  . Drug use: Unknown  . Sexual activity: Not on file   Other Topics Concern  . Not on file   Social History Narrative  . No narrative on file     Family History  Problem Relation Age of Onset  . Stroke Father   . Hypertension Father   . Diabetes Mellitus II Brother      Review of Systems: Review of Systems  Constitutional: Positive for malaise/fatigue. Negative for chills, diaphoresis, fever and weight loss.  HENT: Negative for congestion.        Neck/throat pain  Eyes: Negative for discharge and redness.  Respiratory: Negative for cough, hemoptysis, sputum production, shortness of breath and wheezing.   Cardiovascular: Negative for chest pain, palpitations, orthopnea, claudication, leg swelling and PND.  Gastrointestinal: Negative for abdominal pain, blood in stool, heartburn, melena, nausea and vomiting.  Genitourinary: Negative for hematuria.  Musculoskeletal: Positive for joint pain. Negative for falls and myalgias.       Left shoulder  Skin: Negative for rash.  Neurological: Positive for weakness. Negative for dizziness, tingling, tremors, sensory change, speech change, focal weakness and loss of consciousness.  Endo/Heme/Allergies:  Does not bruise/bleed easily.  Psychiatric/Behavioral: Negative for substance abuse. The patient is not nervous/anxious.   All other systems reviewed and are negative.   Labs:  Recent Labs  11/18/16 1041  TROPONINI <0.03   Lab Results  Component Value Date   WBC 5.3 11/19/2016   HGB 12.8 (L) 11/19/2016   HCT 36.7 (L) 11/19/2016   MCV 90.2 11/19/2016   PLT 257 11/19/2016     Recent Labs Lab 11/19/16 0123  NA 137  K 4.0  CL 107  CO2 25  BUN 13  CREATININE 0.86  CALCIUM 8.9  GLUCOSE 119*   Lab Results  Component Value Date   CHOL 151 11/19/2016   HDL 66 11/19/2016   LDLCALC 73 11/19/2016   TRIG 58 11/19/2016   No results found for: DDIMER  Radiology/Studies:  Dg Chest Portable 1 View  Result Date: 11/18/2016 CLINICAL DATA:  Left arm and jaw pain.  Elevated blood  pressure. EXAM: PORTABLE CHEST 1 VIEW COMPARISON:  None. FINDINGS: The heart size and mediastinal contours are within normal limits. Both lungs are clear. The visualized skeletal structures are unremarkable. IMPRESSION: No active disease. Electronically Signed   By: Dorise Bullion III M.D   On: 11/18/2016 11:57    EKG: Interpreted by me showed: NSR, 84 bpm, probable left atrial enlargement, no acute ST-T changes, horizontal ST depression of 1 mm in lead V3 and V4. Follow-up EKG on 6/18 shows sinus bradycardia, 51 bpm, T-wave inversion in lead III, inferolateral Q waves Telemetry: Interpreted by me showed: sinus bradycardia, 50s bpm currently  Weights: Filed Weights   11/18/16 1028 11/18/16 1532 11/19/16 0500  Weight: 190 lb (86.2 kg) 187 lb 8 oz (85 kg) 188 lb 8 oz (85.5 kg)     Physical Exam: Blood pressure (!) 145/74, pulse (!) 49, temperature 98.4 F (36.9 C), temperature source Oral, resp. rate 16, height 5\' 10"  (1.778 m), weight 188 lb 8 oz (85.5 kg), SpO2 93 %. Body mass index is 27.05 kg/m. General: Well developed, well nourished, in no acute distress. Head: Normocephalic, atraumatic, sclera  non-icteric, no xanthomas, nares are without discharge.  Neck: Negative for carotid bruits. JVD not elevated. Lungs: Clear bilaterally to auscultation without wheezes, rales, or rhonchi. Breathing is unlabored. Heart: Bradycardic with S1 S2. No murmurs, rubs, or gallops appreciated. Abdomen: Soft, non-tender, non-distended with normoactive bowel sounds. No hepatomegaly. No rebound/guarding. No obvious abdominal masses. Msk:  Strength and tone appear normal for age. Extremities: No clubbing or cyanosis. No edema. Distal pedal pulses are 2+ and equal bilaterally. Neuro: Alert and oriented X 3. No facial asymmetry. No focal deficit. Moves all extremities spontaneously. Psych:  Responds to questions appropriately with a normal affect.    Assessment and Plan:  Principal Problem:   Chest pain with moderate risk for cardiac etiology Active Problems:   Essential hypertension   HLD (hyperlipidemia)    1. Chest pain with moderate risk for cardiac etiology: -Currently, without pain -Initial troponin negative 1 at time of admission, unfortunately troponins were not continued to be cycled upon admission -Will check stat troponin this morning -If troponin is negative will proceed with internal medicine recommendation of performing nuclear stress test -Troponin has trended positive we'll cancel stress test and plan for cardiac catheterization -Heparin drip, if troponin comes back negative will stop heparin gtt -ASA -Hold beta blocker given bradycardia noted overnight into the upper 40s bpm -Change simvastatin to Lipitor  -Echocardiogram per internal medicine  2. Accelerated hypertension: -Improving -Titrate amlodipine to 10 mg daily -Metoprolol on hold as above given bradycardia -Will change Cardura to HCTZ and titrate as needed  3. HLD: -Change simvastatin to Lipitor (on amlodipine at home)   Signed, Christell Faith, PA-C [place] Pager: [phone] 11/19/2016, 7:25 AM

## 2016-11-19 NOTE — Progress Notes (Signed)
*  PRELIMINARY RESULTS* Echocardiogram 2D Echocardiogram has been performed.  Bryan Santiago 11/19/2016, 9:09 AM

## 2016-11-19 NOTE — Progress Notes (Signed)
Discharge instructions explained to pt and pts spouse/ verbalized an understanding/ iv and tele removed/ RX given to pt/ transported off unit via wheelchair.  

## 2016-11-19 NOTE — Progress Notes (Signed)
    Patient has ruled out. Will stop heparin gtt. Await treadmill Myoview. Echo showed normal EF with normal wall motion. GR1DD with mildly elevated PASP at 35 mmHg. HCTZ added in place of Cardura. Titrate as needed.

## 2016-11-19 NOTE — Discharge Summary (Signed)
[place] [facility] [place] NAME: Bryan Santiago    MR#:  [id number]  DATE OF BIRTH:  1951-01-15  DATE OF ADMISSION:  11/18/2016 ADMITTING PHYSICIAN: Nicholes Mango, MD  DATE OF DISCHARGE: 11/19/2016  2:29 PM  PRIMARY CARE PHYSICIAN: Rusty Aus, MD    ADMISSION DIAGNOSIS:  Atypical chest pain [R07.89] Chest pain [R07.9] Arm pain, central, left [M79.602]  DISCHARGE DIAGNOSIS:  Principal Problem:   Chest pain with moderate risk for cardiac etiology Active Problems:   Essential hypertension   HLD (hyperlipidemia)   SECONDARY DIAGNOSIS:   Past Medical History:  Diagnosis Date  . Hyperlipemia   . Hypertension     HOSPITAL COURSE:   66 year old male with past medical history of hypertension, hyperlipidemia, GERD who presented to the hospital due to neck pain and jaw pain suspicious for angina.   1. Neck/jaw pain with EKG changes-patient admitted to the hospital with working diagnosis of unstable angina. He was admitted to telemetry, started on a heparin drip, serial cardiac markers were obtained. Patient's cardiac markers remain negative. Shortly after admission patient was asymptomatic. -Patient was seen by cardiology who recommended a nuclear medicine stress test which was done which showed no evidence of acute ischemia. -Patient has been hemodynamically stable and asymptomatic and therefore being discharged home. He will continue follow-up with cardiology as an outpatient.  2. Essential hypertension-patient's blood pressures were somewhat labile and therefore his antihypertensive regimen was adjusted. -He was recently taken off ACE inhibitor due to some concern for angioedema. He will continue his Norvasc, his Cardura dose was increased and HCTZ was added to his regimen.  3. Hyperlipidemia-he will continue simvastatin.  4. GERD - cont. Omeprazole.   DISCHARGE CONDITIONS:   Stable  CONSULTS OBTAINED:  Treatment Team:  Minna Merritts,  MD  DRUG ALLERGIES:   Allergies  Allergen Reactions  . Ace Inhibitors Swelling    angioedema  . Mucinex [Guaifenesin Er]     Facial swelling   . Penicillins Swelling    Has patient had a PCN reaction causing immediate rash, facial/tongue/throat swelling, SOB or lightheadedness with hypotension: Yes Has patient had a PCN reaction causing severe rash involving mucus membranes or skin necrosis: No Has patient had a PCN reaction that required hospitalization: No Has patient had a PCN reaction occurring within the last 10 years: Yes If all of the above answers are "NO", then may proceed with Cephalosporin use.  Happened 06/2016. Facial swelling   . Quinolones     Pt states he doesn't have any recollection of this being an allergy     DISCHARGE MEDICATIONS:   Allergies as of 11/19/2016      Reactions   Ace Inhibitors Swelling   angioedema   Mucinex [guaifenesin Er]    Facial swelling    Penicillins Swelling   Has patient had a PCN reaction causing immediate rash, facial/tongue/throat swelling, SOB or lightheadedness with hypotension: Yes Has patient had a PCN reaction causing severe rash involving mucus membranes or skin necrosis: No Has patient had a PCN reaction that required hospitalization: No Has patient had a PCN reaction occurring within the last 10 years: Yes If all of the above answers are "NO", then may proceed with Cephalosporin use. Happened 06/2016. Facial swelling    Quinolones    Pt states he doesn't have any recollection of this being an allergy       Medication List    TAKE these medications   amLODipine 10 MG  tablet Commonly known as:  NORVASC Take 1 tablet (10 mg total) by mouth daily. What changed:  medication strength  how much to take   doxazosin 4 MG tablet Commonly known as:  CARDURA Take 1 tablet (4 mg total) by mouth daily. What changed:  medication strength  how much to take   hydrochlorothiazide 25 MG tablet Commonly known as:   HYDRODIURIL Take 1 tablet (25 mg total) by mouth daily. Start taking on:  11/20/2016   omeprazole 20 MG capsule Commonly known as:  PRILOSEC Take 20 mg by mouth daily.   simvastatin 40 MG tablet Commonly known as:  ZOCOR Take 40 mg by mouth daily.         DISCHARGE INSTRUCTIONS:   DIET:  Cardiac diet  DISCHARGE CONDITION:  Stable  ACTIVITY:  Activity as tolerated  OXYGEN:  Home Oxygen: No.   Oxygen Delivery: room air  DISCHARGE LOCATION:  home   If you experience worsening of your admission symptoms, develop shortness of breath, life threatening emergency, suicidal or homicidal thoughts you must seek medical attention immediately by calling 911 or calling your MD immediately  if symptoms less severe.  You Must read complete instructions/literature along with all the possible adverse reactions/side effects for all the Medicines you take and that have been prescribed to you. Take any new Medicines after you have completely understood and accpet all the possible adverse reactions/side effects.   Please note  You were cared for by a hospitalist during your hospital stay. If you have any questions about your discharge medications or the care you received while you were in the hospital after you are discharged, you can call the unit and asked to speak with the hospitalist on call if the hospitalist that took care of you is not available. Once you are discharged, your primary care physician will handle any further medical issues. Please note that NO REFILLS for any discharge medications will be authorized once you are discharged, as it is imperative that you return to your primary care physician (or establish a relationship with a primary care physician if you do not have one) for your aftercare needs so that they can reassess your need for medications and monitor your lab values.     Today   No complaints presently. Wife at bedside.  No chest pain, N/V.   VITAL SIGNS:   Blood pressure (!) 153/90, pulse 65, temperature 98.4 F (36.9 C), temperature source Oral, resp. rate 18, height 5\' 10"  (1.778 m), weight 85.5 kg (188 lb 8 oz), SpO2 95 %.  I/O:   Intake/Output Summary (Last 24 hours) at 11/19/16 1540 Last data filed at 11/19/16 1300  Gross per 24 hour  Intake           1577.5 ml  Output             1100 ml  Net            477.5 ml    PHYSICAL EXAMINATION:  GENERAL:  66 y.o.-year-old patient lying in the bed with no acute distress.  EYES: Pupils equal, round, reactive to light and accommodation. No scleral icterus. Extraocular muscles intact.  HEENT: Head atraumatic, normocephalic. Oropharynx and nasopharynx clear.  NECK:  Supple, no jugular venous distention. No thyroid enlargement, no tenderness.  LUNGS: Normal breath sounds bilaterally, no wheezing, rales,rhonchi. No use of accessory muscles of respiration.  CARDIOVASCULAR: S1, S2 normal. No murmurs, rubs, or gallops.  ABDOMEN: Soft, non-tender, non-distended. Bowel sounds present. No organomegaly  or mass.  EXTREMITIES: No pedal edema, cyanosis, or clubbing.  NEUROLOGIC: Cranial nerves II through XII are intact. No focal motor or sensory defecits b/l.  PSYCHIATRIC: The patient is alert and oriented x 3. Good affect.  SKIN: No obvious rash, lesion, or ulcer.   DATA REVIEW:   CBC  Recent Labs Lab 11/19/16 0123  WBC 5.3  HGB 12.8*  HCT 36.7*  PLT 257    Chemistries   Recent Labs Lab 11/19/16 0123  NA 137  K 4.0  CL 107  CO2 25  GLUCOSE 119*  BUN 13  CREATININE 0.86  CALCIUM 8.9    Cardiac Enzymes  Recent Labs Lab 11/19/16 0749  TROPONINI <0.03    Microbiology Results  No results found for this or any previous visit.  RADIOLOGY:  Nm Myocar Multi W/spect W/wall Motion / Ef  Addendum Date: 11/19/2016   Addended report: Exercise myocardial perfusion imaging study with no significant  ischemia Normal wall motion, EF estimated at 43% (depressed EF secondary to GI  uptake artifact) No EKG changes concerning for ischemia at peak stress or in recovery. Echocardiogram confirmed normal EF, >55% Target heart rate achieved HTN at rest, 355 systolic. Peak SBP 220 with stress Low risk scan Signed, Esmond Plants, MD, Ph.D [facility]   Result Date: 11/19/2016 Pharmacological myocardial perfusion imaging study with no significant  ischemia Normal wall motion, EF estimated at 43% (depressed EF secondary to GI uptake artifact) No EKG changes concerning for ischemia at peak stress or in recovery. Echocardiogram confirmed normal EF, >55% HTN at rest, 974 systolic. Peak SBP 220 with stress Low risk scan Signed, Esmond Plants, MD, Ph.D [facility]   Dg Chest Portable 1 View  Result Date: 11/18/2016 CLINICAL DATA:  Left arm and jaw pain.  Elevated blood pressure. EXAM: PORTABLE CHEST 1 VIEW COMPARISON:  None. FINDINGS: The heart size and mediastinal contours are within normal limits. Both lungs are clear. The visualized skeletal structures are unremarkable. IMPRESSION: No active disease. Electronically Signed   By: Dorise Bullion III M.D   On: 11/18/2016 11:57      Management plans discussed with the patient, family and they are in agreement.  CODE STATUS:     Code Status Orders        Start     Ordered   11/18/16 1512  Full code  Continuous     11/18/16 1511    Code Status History    Date Active Date Inactive Code Status Order ID Comments User Context   This patient has a current code status but no historical code status.      TOTAL TIME TAKING CARE OF THIS PATIENT: 40 minutes.    Henreitta Leber M.D on 11/19/2016 at 3:40 PM  Between 7am to 6pm - Pager - [phone]  After 6pm go to [email or web address] - Proofreader  [facility]  Office  [phone]  CC: Primary care physician; Rusty Aus, MD

## 2016-11-19 NOTE — Care Management (Signed)
No discharge needs identified by members of the care team 

## 2016-11-20 ENCOUNTER — Telehealth: Payer: Self-pay

## 2016-11-20 NOTE — Telephone Encounter (Signed)
Patient wants to know what Dr. Rockey Situ thinks of his bp please call to discuss concerns.

## 2016-11-20 NOTE — Telephone Encounter (Signed)
Patient contacted regarding discharge from [facility] on 11/20/16.  Patient understands to follow up with Dr. Rockey Situ on 12/10/16 at 2:20 at [facility] Patient understands discharge instructions? yes Patient understands medications and regiment? yes Patient understands to bring all medications to this visit? yes  Pt is concerned about his BP "jumping all over the place". Encouraged him to record readings and bring to his ov.

## 2016-11-20 NOTE — Telephone Encounter (Signed)
Advised pt to bring readings to his ov for Dr. Rockey Situ to review.

## 2016-11-20 NOTE — Telephone Encounter (Signed)
-----   Message from Blain Pais sent at 11/20/2016  9:21 AM EDT ----- Regarding: tcm/ph 7/9 2:20 Dr. Rockey Situ

## 2016-11-21 DIAGNOSIS — R0789 Other chest pain: Secondary | ICD-10-CM | POA: Diagnosis not present

## 2016-11-21 DIAGNOSIS — I10 Essential (primary) hypertension: Secondary | ICD-10-CM | POA: Diagnosis not present

## 2016-11-29 DIAGNOSIS — Z0000 Encounter for general adult medical examination without abnormal findings: Secondary | ICD-10-CM | POA: Diagnosis not present

## 2016-11-29 DIAGNOSIS — I10 Essential (primary) hypertension: Secondary | ICD-10-CM | POA: Diagnosis not present

## 2016-12-06 NOTE — Progress Notes (Deleted)
Cardiology Office Note  Date:  12/06/2016   ID:  Bryan Santiago, DOB May 02, 1951, MRN [id number]  PCP:  Rusty Aus, MD   No chief complaint on file.   HPI:  66 year old gentleman with hypertension, hyperlipidemia presenting to the hospital with chest, neck, jaw pain 9 AM June 17  He reports he had a busy week last week, lawnmowing on Friday,weedeating much of Saturday, Sunday woke up felt fine was sitting in a chair when he developed chest, throat, neck pain radiating into his left shoulder down his left arm to his elbow He checked his blood pressure which was high 646 systolic  He reports having recent side effect to ACE inhibitor and medication was changed several days ago secondary to facial swelling. Thinks he was started on Cardura  ----Atypical chest pain Stress test reviewed showing no ischemia,  normal ejection fraction on echocardiogram Etiology of his pain is unclear, possibly musculoskeletal Unable to exclude GI etiology. He is on Nexium for GERD   --- Hypertension Elevated blood pressure even at rest prior to stress testing Blood pressure 220 at peak stress Recommend we increase amlodipine up to 10 mg daily, he was taking this previously Okay to stay on Cardura if he chooses, could increase up to 2 mg twice a day if blood pressure continues to run high Recommended he follow-up with primary care for further medication titration  ----Hyperlipidemia Cholesterol reasonable , 803 Certainly potential for reaction/myalgias with amlodipine and change him to Lipitor  --- GERD On Nexium    PMH:   has a past medical history of Hyperlipemia and Hypertension.  PSH:   No past surgical history on file.  Current Outpatient Prescriptions  Medication Sig Dispense Refill  . amLODipine (NORVASC) 10 MG tablet Take 1 tablet (10 mg total) by mouth daily. 30 tablet 1  . doxazosin (CARDURA) 4 MG tablet Take 1 tablet (4 mg total) by mouth daily.    . hydrochlorothiazide  (HYDRODIURIL) 25 MG tablet Take 1 tablet (25 mg total) by mouth daily. 30 tablet 1  . omeprazole (PRILOSEC) 20 MG capsule Take 20 mg by mouth daily.    . simvastatin (ZOCOR) 40 MG tablet Take 40 mg by mouth daily.     No current facility-administered medications for this visit.      Allergies:   Ace inhibitors; Mucinex [guaifenesin er]; Penicillins; and Quinolones   Social History:  The patient  reports that he has never smoked. He has never used smokeless tobacco.   Family History:   family history includes Diabetes Mellitus II in his brother; Hypertension in his father; Stroke in his father.    Review of Systems: ROS   PHYSICAL EXAM: VS:  There were no vitals taken for this visit. , BMI There is no height or weight on file to calculate BMI. GEN: Well nourished, well developed, in no acute distress HEENT: normal Neck: no JVD, carotid bruits, or masses Cardiac: RRR; no murmurs, rubs, or gallops,no edema  Respiratory:  clear to auscultation bilaterally, normal work of breathing GI: soft, nontender, nondistended, + BS MS: no deformity or atrophy Skin: warm and dry, no rash Neuro:  Strength and sensation are intact Psych: euthymic mood, full affect    Recent Labs: 11/19/2016: BUN 13; Creatinine, Ser 0.86; Hemoglobin 12.8; Platelets 257; Potassium 4.0; Sodium 137    Lipid Panel Lab Results  Component Value Date   CHOL 151 11/19/2016   HDL 66 11/19/2016   LDLCALC 73 11/19/2016   TRIG 58 11/19/2016  Wt Readings from Last 3 Encounters:  11/19/16 188 lb 8 oz (85.5 kg)       ASSESSMENT AND PLAN:  No diagnosis found.   Disposition:   F/U  6 months  No orders of the defined types were placed in this encounter.    Signed, Esmond Plants, M.D., Ph.D. 12/06/2016  [place], [place]

## 2016-12-07 ENCOUNTER — Ambulatory Visit: Payer: PPO | Admitting: Cardiovascular Disease

## 2016-12-20 DIAGNOSIS — M1711 Unilateral primary osteoarthritis, right knee: Secondary | ICD-10-CM | POA: Diagnosis not present

## 2016-12-23 DIAGNOSIS — M1711 Unilateral primary osteoarthritis, right knee: Secondary | ICD-10-CM

## 2017-01-23 DIAGNOSIS — M76899 Other specified enthesopathies of unspecified lower limb, excluding foot: Secondary | ICD-10-CM | POA: Diagnosis not present

## 2017-01-23 DIAGNOSIS — M1711 Unilateral primary osteoarthritis, right knee: Secondary | ICD-10-CM | POA: Diagnosis not present

## 2017-04-11 DIAGNOSIS — M25561 Pain in right knee: Secondary | ICD-10-CM | POA: Diagnosis not present

## 2017-04-15 ENCOUNTER — Encounter: Payer: Self-pay | Admitting: *Deleted

## 2017-04-16 ENCOUNTER — Ambulatory Visit: Payer: PPO | Admitting: Anesthesiology

## 2017-04-16 ENCOUNTER — Ambulatory Visit
Admission: RE | Admit: 2017-04-16 | Discharge: 2017-04-16 | Disposition: A | Discharge status: Home / Self Care | Payer: PPO | Source: Ambulatory Visit | Attending: Gastroenterology | Admitting: Gastroenterology

## 2017-04-16 ENCOUNTER — Encounter: Payer: Self-pay | Admitting: *Deleted

## 2017-04-16 ENCOUNTER — Encounter
Admission: RE | Disposition: A | Discharge status: Home / Self Care | Payer: Self-pay | Source: Ambulatory Visit | Attending: Gastroenterology

## 2017-04-16 DIAGNOSIS — Z79899 Other long term (current) drug therapy: Secondary | ICD-10-CM | POA: Diagnosis not present

## 2017-04-16 DIAGNOSIS — K640 First degree hemorrhoids: Secondary | ICD-10-CM | POA: Diagnosis not present

## 2017-04-16 DIAGNOSIS — I10 Essential (primary) hypertension: Secondary | ICD-10-CM | POA: Diagnosis not present

## 2017-04-16 DIAGNOSIS — Z87891 Personal history of nicotine dependence: Secondary | ICD-10-CM | POA: Insufficient documentation

## 2017-04-16 DIAGNOSIS — K5730 Diverticulosis of large intestine without perforation or abscess without bleeding: Secondary | ICD-10-CM | POA: Insufficient documentation

## 2017-04-16 DIAGNOSIS — E785 Hyperlipidemia, unspecified: Secondary | ICD-10-CM | POA: Insufficient documentation

## 2017-04-16 DIAGNOSIS — Z1211 Encounter for screening for malignant neoplasm of colon: Secondary | ICD-10-CM | POA: Diagnosis not present

## 2017-04-16 DIAGNOSIS — K5790 Diverticulosis of intestine, part unspecified, without perforation or abscess without bleeding: Secondary | ICD-10-CM | POA: Diagnosis not present

## 2017-04-16 DIAGNOSIS — Z85828 Personal history of other malignant neoplasm of skin: Secondary | ICD-10-CM | POA: Insufficient documentation

## 2017-04-16 DIAGNOSIS — K648 Other hemorrhoids: Secondary | ICD-10-CM | POA: Diagnosis not present

## 2017-04-16 HISTORY — PX: COLONOSCOPY WITH PROPOFOL: SHX5780

## 2017-04-16 HISTORY — DX: Deficiency of other specified B group vitamins: E53.8

## 2017-04-16 HISTORY — DX: Allergic rhinitis, unspecified: J30.9

## 2017-04-16 HISTORY — DX: Malignant (primary) neoplasm, unspecified: C80.1

## 2017-04-16 SURGERY — COLONOSCOPY WITH PROPOFOL
Anesthesia: General

## 2017-04-16 MED ORDER — PROPOFOL 10 MG/ML IV BOLUS
INTRAVENOUS | Status: AC
  Filled 2017-04-16: qty 20

## 2017-04-16 MED ORDER — PROPOFOL 500 MG/50ML IV EMUL
INTRAVENOUS | Status: AC
  Filled 2017-04-16: qty 50

## 2017-04-16 MED ORDER — MIDAZOLAM HCL 2 MG/2ML IJ SOLN
INTRAMUSCULAR | Status: AC
  Filled 2017-04-16: qty 2

## 2017-04-16 MED ORDER — SODIUM CHLORIDE 0.9 % IV SOLN: INTRAVENOUS | Status: DC

## 2017-04-16 MED ORDER — PROPOFOL 10 MG/ML IV BOLUS
INTRAVENOUS | Status: DC | PRN
  Administered 2017-04-16: 50 mg via INTRAVENOUS

## 2017-04-16 MED ORDER — LIDOCAINE HCL (PF) 2 % IJ SOLN
INTRAMUSCULAR | Status: AC
  Filled 2017-04-16: qty 10

## 2017-04-16 MED ORDER — FENTANYL CITRATE (PF) 100 MCG/2ML IJ SOLN
INTRAMUSCULAR | Status: DC | PRN
  Administered 2017-04-16: 50 ug via INTRAVENOUS

## 2017-04-16 MED ORDER — MIDAZOLAM HCL 2 MG/2ML IJ SOLN
INTRAMUSCULAR | Status: DC | PRN
  Administered 2017-04-16: 2 mg via INTRAVENOUS

## 2017-04-16 MED ORDER — FENTANYL CITRATE (PF) 100 MCG/2ML IJ SOLN
INTRAMUSCULAR | Status: AC
  Filled 2017-04-16: qty 2

## 2017-04-16 MED ORDER — EPHEDRINE SULFATE 50 MG/ML IJ SOLN
INTRAMUSCULAR | Status: DC | PRN
  Administered 2017-04-16: 10 mg via INTRAVENOUS

## 2017-04-16 MED ORDER — PROPOFOL 500 MG/50ML IV EMUL
INTRAVENOUS | Status: DC | PRN
  Administered 2017-04-16: 140 ug/kg/min via INTRAVENOUS

## 2017-04-16 MED ORDER — SODIUM CHLORIDE 0.9 % IV SOLN
INTRAVENOUS | Status: DC
  Administered 2017-04-16: 09:00:00 via INTRAVENOUS
  Administered 2017-04-16: 1000 mL via INTRAVENOUS

## 2017-04-16 NOTE — H&P (Signed)
Outpatient short stay form Pre-procedure 04/16/2017 8:39 AM Bryan Sails MD  Primary Physician: Dr. Emily Filbert  Reason for visit:  Screening colonoscopy  History of present illness:  Patient is a 66 year old male presenting today as above. His last colonoscopy was in 2008. He tolerated his prep well. He takes no aspirin or blood thinning agents.    Current Facility-Administered Medications:  .  0.9 %  sodium chloride infusion, , Intravenous, Continuous, Bryan Sails, MD, Last Rate: 20 mL/hr at 04/16/17 0834, 1,000 mL at 04/16/17 0834 .  0.9 %  sodium chloride infusion, , Intravenous, Continuous, Bryan Sails, MD  Medications Prior to Admission  Medication Sig Dispense Refill Last Dose  . amLODipine (NORVASC) 5 MG tablet Take 5 mg 2 (two) times daily by mouth.   04/16/2017 at Unknown time  . telmisartan (MICARDIS) 80 MG tablet Take 80 mg daily by mouth.   04/16/2017 at Unknown time  . etodolac (LODINE) 400 MG tablet Take 400 mg every morning by mouth.     Marland Kitchen omeprazole (PRILOSEC) 40 MG capsule Take 40 mg daily by mouth.     . simvastatin (ZOCOR) 40 MG tablet Take 40 mg every morning by mouth.    11/18/2016 at am     Allergies  Allergen Reactions  . Ace Inhibitors Swelling    angioedema  . Mucinex [Guaifenesin Er]     Facial swelling   . Penicillins Swelling    Has patient had a PCN reaction causing immediate rash, facial/tongue/throat swelling, SOB or lightheadedness with hypotension: Yes Has patient had a PCN reaction causing severe rash involving mucus membranes or skin necrosis: No Has patient had a PCN reaction that required hospitalization: No Has patient had a PCN reaction occurring within the last 10 years: Yes If all of the above answers are "NO", then may proceed with Cephalosporin use.  Happened 06/2016. Facial swelling   . Quinolones     Pt states he doesn't have any recollection of this being an allergy      Past Medical History:  Diagnosis Date   . Allergic rhinitis   . Cancer ([place])    skin cancer  . Hyperlipemia   . Hypertension   . Vitamin B 12 deficiency     Review of systems:      Physical Exam    Heart and lungs: Regular rate and rhythm without rub or gallop, lungs are bilaterally clear.    HEENT: Normocephalic atraumatic eyes are anicteric    Other:     Pertinant exam for procedure: Soft nontender nondistended bowel sounds positive normoactive.    Planned proceedures: Colonoscopy and indicated procedures. I have discussed the risks benefits and complications of procedures to include not limited to bleeding, infection, perforation and the risk of sedation and the patient wishes to proceed.    Bryan Sails, MD Gastroenterology 04/16/2017  8:39 AM

## 2017-04-16 NOTE — Anesthesia Postprocedure Evaluation (Signed)
Anesthesia Post Note  Patient: Bryan Santiago  Procedure(s) Performed: COLONOSCOPY WITH PROPOFOL (N/A )  Patient location during evaluation: Endoscopy Anesthesia Type: General Level of consciousness: awake and alert Pain management: pain level controlled Vital Signs Assessment: post-procedure vital signs reviewed and stable Respiratory status: spontaneous breathing, nonlabored ventilation, respiratory function stable and patient connected to nasal cannula oxygen Cardiovascular status: blood pressure returned to baseline and stable Postop Assessment: no apparent nausea or vomiting Anesthetic complications: no     Last Vitals:  Vitals:   04/16/17 0940 04/16/17 0950  BP: 108/79 124/77  Pulse: 67 65  Resp: 16 18  Temp:    SpO2: 100% 99%    Last Pain:  Vitals:   04/16/17 0920  TempSrc: Tympanic                 Maudene Stotler S

## 2017-04-16 NOTE — Anesthesia Preprocedure Evaluation (Signed)
Anesthesia Evaluation  Patient identified by MRN, date of birth, ID band Patient awake    Reviewed: Allergy & Precautions, NPO status , Patient's Chart, lab work & pertinent test results, reviewed documented beta blocker date and time   Airway Mallampati: III  TM Distance: >3 FB     Dental  (+) Chipped   Pulmonary former smoker,           Cardiovascular hypertension, Pt. on medications      Neuro/Psych    GI/Hepatic   Endo/Other    Renal/GU      Musculoskeletal   Abdominal   Peds  Hematology   Anesthesia Other Findings EF 60.  Reproductive/Obstetrics                             Anesthesia Physical Anesthesia Plan  ASA: III  Anesthesia Plan: General   Post-op Pain Management:    Induction: Intravenous  PONV Risk Score and Plan:   Airway Management Planned:   Additional Equipment:   Intra-op Plan:   Post-operative Plan:   Informed Consent: I have reviewed the patients History and Physical, chart, labs and discussed the procedure including the risks, benefits and alternatives for the proposed anesthesia with the patient or authorized representative who has indicated his/her understanding and acceptance.     Plan Discussed with: CRNA  Anesthesia Plan Comments:         Anesthesia Quick Evaluation

## 2017-04-16 NOTE — Op Note (Signed)
[facility] Gastroenterology Patient Name: Bryan Santiago Procedure Date: 04/16/2017 8:33 AM MRN: [id number] Account #: [id number] Date of Birth: 1951/03/30 Admit Type: Outpatient Age: 66 Room: [facility] ENDO ROOM 3 Gender: Male Note Status: Finalized Procedure:            Colonoscopy Indications:          Screening for colorectal malignant neoplasm Providers:            Lollie Sails, MD Referring MD:         Rusty Aus, MD (Referring MD) Medicines:            Monitored Anesthesia Care Complications:        No immediate complications. Procedure:            Pre-Anesthesia Assessment:                       - ASA Grade Assessment: III - A patient with severe                        systemic disease.                       After obtaining informed consent, the colonoscope was                        passed under direct vision. Throughout the procedure,                        the patient's blood pressure, pulse, and oxygen                        saturations were monitored continuously. The                        Colonoscope was introduced through the anus and                        advanced to the the cecum, identified by appendiceal                        orifice and ileocecal valve. The colonoscopy was                        performed without difficulty. The patient tolerated the                        procedure well. The quality of the bowel preparation                        was good. Findings:      Multiple small-mouthed diverticula were found in the sigmoid colon.      Non-bleeding internal hemorrhoids were found during retroflexion and       during anoscopy. The hemorrhoids were medium-sized and Grade I (internal       hemorrhoids that do not prolapse).      The digital rectal exam was normal otherwise.      The exam was otherwise without abnormality. Impression:           - Diverticulosis in the sigmoid colon.                       -  Non-bleeding internal  hemorrhoids.                       - No specimens collected. Recommendation:       - Repeat colonoscopy in 10 years for screening purposes. Procedure Code(s):    --- Professional ---                       [phone], Colonoscopy, flexible; diagnostic, including                        collection of specimen(s) by brushing or washing, when                        performed (separate procedure) Diagnosis Code(s):    --- Professional ---                       Z12.11, Encounter for screening for malignant neoplasm                        of colon                       K64.0, First degree hemorrhoids                       K57.30, Diverticulosis of large intestine without                        perforation or abscess without bleeding CPT copyright 2016 American Medical Association. All rights reserved. The codes documented in this report are preliminary and upon coder review may  be revised to meet current compliance requirements. Lollie Sails, MD 04/16/2017 9:13:06 AM This report has been signed electronically. Number of Addenda: 0 Note Initiated On: 04/16/2017 8:33 AM Scope Withdrawal Time: 0 hours 6 minutes 14 seconds  Total Procedure Duration: 0 hours 15 minutes 16 seconds       [facility]

## 2017-04-16 NOTE — Anesthesia Procedure Notes (Signed)
Date/Time: 04/16/2017 8:46 AM Performed by: Allean Found, CRNA Pre-anesthesia Checklist: Patient identified, Emergency Drugs available, Suction available, Patient being monitored and Timeout performed Patient Re-evaluated:Patient Re-evaluated prior to induction Placement Confirmation: positive ETCO2

## 2017-04-16 NOTE — Transfer of Care (Signed)
Immediate Anesthesia Transfer of Care Note  Patient: ADEN SEK  Procedure(s) Performed: COLONOSCOPY WITH PROPOFOL (N/A )  Patient Location: PACU  Anesthesia Type:General  Level of Consciousness: sedated  Airway & Oxygen Therapy: Patient Spontanous Breathing and Patient connected to nasal cannula oxygen  Post-op Assessment: Report given to RN and Post -op Vital signs reviewed and stable  Post vital signs: Reviewed and stable  Last Vitals:  Vitals:   04/16/17 0820 04/16/17 0920  BP: 128/75 94/61  Pulse: 78 68  Resp: 20 20  Temp: (!) 36.4 C (!) 35.9 C  SpO2: 98% 98%    Last Pain:  Vitals:   04/16/17 0920  TempSrc: Tympanic         Complications: No apparent anesthesia complications

## 2017-04-16 NOTE — Anesthesia Post-op Follow-up Note (Signed)
Anesthesia QCDR form completed.        

## 2017-04-17 ENCOUNTER — Encounter
Admission: RE | Admit: 2017-04-17 | Discharge: 2017-04-17 | Disposition: A | Discharge status: Home / Self Care | Payer: PPO | Source: Ambulatory Visit | Attending: Orthopedic Surgery | Admitting: Orthopedic Surgery

## 2017-04-17 ENCOUNTER — Other Ambulatory Visit: Payer: Self-pay

## 2017-04-17 DIAGNOSIS — E785 Hyperlipidemia, unspecified: Secondary | ICD-10-CM | POA: Insufficient documentation

## 2017-04-17 DIAGNOSIS — Z01812 Encounter for preprocedural laboratory examination: Secondary | ICD-10-CM | POA: Diagnosis not present

## 2017-04-17 DIAGNOSIS — Z79899 Other long term (current) drug therapy: Secondary | ICD-10-CM | POA: Insufficient documentation

## 2017-04-17 DIAGNOSIS — Z0183 Encounter for blood typing: Secondary | ICD-10-CM | POA: Diagnosis not present

## 2017-04-17 DIAGNOSIS — Z880 Allergy status to penicillin: Secondary | ICD-10-CM | POA: Diagnosis not present

## 2017-04-17 DIAGNOSIS — Z888 Allergy status to other drugs, medicaments and biological substances status: Secondary | ICD-10-CM | POA: Insufficient documentation

## 2017-04-17 DIAGNOSIS — I10 Essential (primary) hypertension: Secondary | ICD-10-CM | POA: Diagnosis not present

## 2017-04-17 HISTORY — DX: Unspecified osteoarthritis, unspecified site: M19.90

## 2017-04-17 HISTORY — DX: Personal history of other diseases of the digestive system: Z87.19

## 2017-04-17 HISTORY — DX: Gastro-esophageal reflux disease without esophagitis: K21.9

## 2017-04-17 LAB — PROTIME-INR
INR: 0.89
PROTHROMBIN TIME: 12 s (ref 11.4–15.2)

## 2017-04-17 LAB — COMPREHENSIVE METABOLIC PANEL
ALT: 23 U/L (ref 17–63)
AST: 28 U/L (ref 15–41)
Albumin: 4.3 g/dL (ref 3.5–5.0)
Alkaline Phosphatase: 64 U/L (ref 38–126)
Anion gap: 8 (ref 5–15)
BILIRUBIN TOTAL: 0.7 mg/dL (ref 0.3–1.2)
BUN: 12 mg/dL (ref 6–20)
CO2: 25 mmol/L (ref 22–32)
Calcium: 9.1 mg/dL (ref 8.9–10.3)
Chloride: 104 mmol/L (ref 101–111)
Creatinine, Ser: 0.86 mg/dL (ref 0.61–1.24)
Glucose, Bld: 87 mg/dL (ref 65–99)
POTASSIUM: 4 mmol/L (ref 3.5–5.1)
Sodium: 137 mmol/L (ref 135–145)
TOTAL PROTEIN: 7.7 g/dL (ref 6.5–8.1)

## 2017-04-17 LAB — URINALYSIS, ROUTINE W REFLEX MICROSCOPIC
BILIRUBIN URINE: NEGATIVE
GLUCOSE, UA: NEGATIVE mg/dL
HGB URINE DIPSTICK: NEGATIVE
Ketones, ur: NEGATIVE mg/dL
Leukocytes, UA: NEGATIVE
NITRITE: NEGATIVE
PH: 6 (ref 5.0–8.0)
Protein, ur: NEGATIVE mg/dL
SPECIFIC GRAVITY, URINE: 1.008 (ref 1.005–1.030)

## 2017-04-17 LAB — CBC
HEMATOCRIT: 40.1 % (ref 40.0–52.0)
Hemoglobin: 13.7 g/dL (ref 13.0–18.0)
MCH: 31.7 pg (ref 26.0–34.0)
MCHC: 34.1 g/dL (ref 32.0–36.0)
MCV: 93 fL (ref 80.0–100.0)
PLATELETS: 360 10*3/uL (ref 150–440)
RBC: 4.32 MIL/uL — ABNORMAL LOW (ref 4.40–5.90)
RDW: 12.8 % (ref 11.5–14.5)
WBC: 4.3 10*3/uL (ref 3.8–10.6)

## 2017-04-17 LAB — TYPE AND SCREEN
ABO/RH(D): O POS
ANTIBODY SCREEN: NEGATIVE

## 2017-04-17 LAB — SEDIMENTATION RATE: Sed Rate: 12 mm/hr (ref 0–20)

## 2017-04-17 LAB — C-REACTIVE PROTEIN

## 2017-04-17 LAB — SURGICAL PCR SCREEN
MRSA, PCR: NEGATIVE
STAPHYLOCOCCUS AUREUS: NEGATIVE

## 2017-04-17 LAB — APTT: aPTT: 26 seconds (ref 24–36)

## 2017-04-17 NOTE — Patient Instructions (Signed)
Your procedure is scheduled [id number] 28, 2018 [facility] ) Report to Same Day Surgery.([facility] ) SECOND FLOOR To find out your arrival time please call [phone] between 1PM - 3PM on April 30, 2017( TUESDAY )   Remember: Instructions that are not followed completely may result in serious medical risk, up to and including death, or upon the discretion of your surgeon and anesthesiologist your surgery may need to be rescheduled.     _X__ 1. Do not eat food after midnight the night before your procedure.                 No gum chewing or hard candies. You may drink clear liquids up to 2 hours                 before you are scheduled to arrive for your surgery- DO not drink clear                 liquids within 2 hours of the start of your surgery.                 Clear Liquids include:  water, apple juice without pulp, clear carbohydrate                 drink such as Clearfast of Gartorade, Black Coffee or Tea (Do not add                 anything to coffee or tea).     _X__ 2.  No Alcohol for 24 hours before or after surgery.   _X__ 3.  Do Not Smoke or use e-cigarettes For 24 Hours Prior to Your Surgery.                 Do not use any chewable tobacco products for at least 6 hours prior to                 surgery.  ____  4.  Bring all medications with you on the day of surgery if instructed.   __X_  5.  Notify your doctor if there is any change in your medical condition      (cold, fever, infections).     Do not wear jewelry, make-up, hairpins, clips or nail polish. Do not wear lotions, powders, or perfumes.  Do not shave 48 hours prior to surgery. Men may shave face and neck. Do not bring valuables to the hospital.    [facility] is not responsible for any belongings or valuables.  Contacts, dentures or bridgework may not be worn into surgery. Leave your suitcase in the car. After surgery it may be brought to your room. For patients admitted to  the hospital, discharge time is determined by your treatment team.   Patients discharged the day of surgery will not be allowed to drive home.   Please read over the following fact sheets that you were given:   MRSA Information          ____ Take these medicines the morning of surgery with A SIP OF WATER:    1. AMLODIPINE  2. SIMVASTATIN  3. OMEPRAZOLE  4. OMEPRAZOLE AT BEDTIME ON TUESDAY NIGHT, NOVEMBER 27  5.  6.  ____ Fleet Enema (as directed)   __X__ Use CHG Soap as directed  ____ Use inhalers on the day of surgery  ____ Stop metformin 2 days prior to surgery    ____ Take 1/2 of usual insulin dose the night before  surgery. No insulin the morning          of surgery.   __X__ Stop Coumadin/Plavix/aspirin on (NO ASPIRIN )  __X__ Stop Anti-inflammatories on (STOP ETODOLAC SEVEN DAYS BEFORE SURGERY AND NO ASPIRIN PRODUCTS , OKAY TO TAKE TYLENOL IF NEEDED )   ____ Stop supplements until after surgery.    ____ Bring C-Pap to the hospital.

## 2017-04-18 LAB — URINE CULTURE
Culture: NO GROWTH
Special Requests: NORMAL

## 2017-04-30 MED ORDER — TRANEXAMIC ACID 1000 MG/10ML IV SOLN
1000.0000 mg | INTRAVENOUS | Status: AC
  Administered 2017-05-01: 1000 mg via INTRAVENOUS
  Filled 2017-04-30: qty 10

## 2017-04-30 MED ORDER — CLINDAMYCIN PHOSPHATE 900 MG/50ML IV SOLN
900.0000 mg | INTRAVENOUS | Status: AC
  Administered 2017-05-01: 900 mg via INTRAVENOUS

## 2017-05-01 ENCOUNTER — Inpatient Hospital Stay: Payer: PPO | Admitting: Anesthesiology

## 2017-05-01 ENCOUNTER — Encounter
Admission: RE | Disposition: A | Discharge status: Home Health | Payer: Self-pay | Source: Ambulatory Visit | Attending: Orthopedic Surgery

## 2017-05-01 ENCOUNTER — Other Ambulatory Visit: Payer: Self-pay

## 2017-05-01 ENCOUNTER — Encounter: Payer: Self-pay | Admitting: *Deleted

## 2017-05-01 ENCOUNTER — Inpatient Hospital Stay: Payer: PPO

## 2017-05-01 ENCOUNTER — Inpatient Hospital Stay
Admission: RE | Admit: 2017-05-01 | Discharge: 2017-05-03 | DRG: 470 | Disposition: A | Discharge status: Home Health | Payer: PPO | Source: Ambulatory Visit | Attending: Orthopedic Surgery | Admitting: Orthopedic Surgery

## 2017-05-01 DIAGNOSIS — M1711 Unilateral primary osteoarthritis, right knee: Principal | ICD-10-CM | POA: Diagnosis present

## 2017-05-01 DIAGNOSIS — Z823 Family history of stroke: Secondary | ICD-10-CM | POA: Diagnosis not present

## 2017-05-01 DIAGNOSIS — Z8249 Family history of ischemic heart disease and other diseases of the circulatory system: Secondary | ICD-10-CM | POA: Diagnosis not present

## 2017-05-01 DIAGNOSIS — Z23 Encounter for immunization: Secondary | ICD-10-CM | POA: Diagnosis not present

## 2017-05-01 DIAGNOSIS — Z880 Allergy status to penicillin: Secondary | ICD-10-CM | POA: Diagnosis not present

## 2017-05-01 DIAGNOSIS — Z96659 Presence of unspecified artificial knee joint: Secondary | ICD-10-CM

## 2017-05-01 DIAGNOSIS — E785 Hyperlipidemia, unspecified: Secondary | ICD-10-CM | POA: Diagnosis not present

## 2017-05-01 DIAGNOSIS — Z85828 Personal history of other malignant neoplasm of skin: Secondary | ICD-10-CM | POA: Diagnosis not present

## 2017-05-01 DIAGNOSIS — Z87891 Personal history of nicotine dependence: Secondary | ICD-10-CM | POA: Diagnosis not present

## 2017-05-01 DIAGNOSIS — Z96651 Presence of right artificial knee joint: Secondary | ICD-10-CM

## 2017-05-01 DIAGNOSIS — I959 Hypotension, unspecified: Secondary | ICD-10-CM | POA: Diagnosis not present

## 2017-05-01 DIAGNOSIS — I10 Essential (primary) hypertension: Secondary | ICD-10-CM | POA: Diagnosis not present

## 2017-05-01 DIAGNOSIS — Z833 Family history of diabetes mellitus: Secondary | ICD-10-CM | POA: Diagnosis not present

## 2017-05-01 DIAGNOSIS — E538 Deficiency of other specified B group vitamins: Secondary | ICD-10-CM | POA: Diagnosis not present

## 2017-05-01 DIAGNOSIS — K219 Gastro-esophageal reflux disease without esophagitis: Secondary | ICD-10-CM | POA: Diagnosis present

## 2017-05-01 DIAGNOSIS — Z471 Aftercare following joint replacement surgery: Secondary | ICD-10-CM | POA: Diagnosis not present

## 2017-05-01 DIAGNOSIS — Z888 Allergy status to other drugs, medicaments and biological substances status: Secondary | ICD-10-CM | POA: Diagnosis not present

## 2017-05-01 DIAGNOSIS — R269 Unspecified abnormalities of gait and mobility: Secondary | ICD-10-CM | POA: Diagnosis not present

## 2017-05-01 DIAGNOSIS — M25561 Pain in right knee: Secondary | ICD-10-CM | POA: Diagnosis present

## 2017-05-01 DIAGNOSIS — K5790 Diverticulosis of intestine, part unspecified, without perforation or abscess without bleeding: Secondary | ICD-10-CM | POA: Diagnosis present

## 2017-05-01 HISTORY — PX: KNEE ARTHROPLASTY: SHX992

## 2017-05-01 LAB — ABO/RH: ABO/RH(D): O POS

## 2017-05-01 SURGERY — ARTHROPLASTY, KNEE, TOTAL, USING IMAGELESS COMPUTER-ASSISTED NAVIGATION
Anesthesia: Spinal | Site: Knee | Laterality: Right | Wound class: Clean

## 2017-05-01 MED ORDER — ACETAMINOPHEN 650 MG RE SUPP: 650.0000 mg | RECTAL | Status: DC | PRN

## 2017-05-01 MED ORDER — LIDOCAINE HCL (PF) 2 % IJ SOLN
INTRAMUSCULAR | Status: AC
  Filled 2017-05-01: qty 10

## 2017-05-01 MED ORDER — OXYCODONE HCL 5 MG PO TABS: 5.0000 mg | ORAL_TABLET | ORAL | Status: DC | PRN

## 2017-05-01 MED ORDER — AMLODIPINE BESYLATE 5 MG PO TABS
5.0000 mg | ORAL_TABLET | Freq: Two times a day (BID) | ORAL | Status: DC
  Administered 2017-05-02 – 2017-05-03 (×3): 5 mg via ORAL
  Filled 2017-05-01 (×4): qty 1

## 2017-05-01 MED ORDER — SIMVASTATIN 20 MG PO TABS
40.0000 mg | ORAL_TABLET | ORAL | Status: DC
  Administered 2017-05-02 – 2017-05-03 (×2): 40 mg via ORAL
  Filled 2017-05-01 (×2): qty 2

## 2017-05-01 MED ORDER — ACETAMINOPHEN 10 MG/ML IV SOLN
INTRAVENOUS | Status: AC
  Filled 2017-05-01: qty 100

## 2017-05-01 MED ORDER — ENOXAPARIN SODIUM 30 MG/0.3ML ~~LOC~~ SOLN
30.0000 mg | Freq: Two times a day (BID) | SUBCUTANEOUS | Status: DC
  Administered 2017-05-02 – 2017-05-03 (×3): 30 mg via SUBCUTANEOUS
  Filled 2017-05-01 (×3): qty 0.3

## 2017-05-01 MED ORDER — INFLUENZA VAC SPLIT HIGH-DOSE 0.5 ML IM SUSY
0.5000 mL | PREFILLED_SYRINGE | INTRAMUSCULAR | Status: AC
  Administered 2017-05-03: 0.5 mL via INTRAMUSCULAR
  Filled 2017-05-01 (×2): qty 0.5

## 2017-05-01 MED ORDER — BISACODYL 10 MG RE SUPP: 10.0000 mg | Freq: Every day | RECTAL | Status: DC | PRN

## 2017-05-01 MED ORDER — FENTANYL CITRATE (PF) 100 MCG/2ML IJ SOLN: 25.0000 ug | INTRAMUSCULAR | Status: DC | PRN

## 2017-05-01 MED ORDER — PANTOPRAZOLE SODIUM 40 MG PO TBEC
40.0000 mg | DELAYED_RELEASE_TABLET | Freq: Two times a day (BID) | ORAL | Status: DC
  Administered 2017-05-01 – 2017-05-03 (×4): 40 mg via ORAL
  Filled 2017-05-01 (×4): qty 1

## 2017-05-01 MED ORDER — MAGNESIUM HYDROXIDE 400 MG/5ML PO SUSP
30.0000 mL | Freq: Every day | ORAL | Status: DC | PRN
  Administered 2017-05-02 – 2017-05-03 (×2): 30 mL via ORAL
  Filled 2017-05-01: qty 30

## 2017-05-01 MED ORDER — NEOMYCIN-POLYMYXIN B GU 40-200000 IR SOLN
Status: AC
  Filled 2017-05-01: qty 20

## 2017-05-01 MED ORDER — METOCLOPRAMIDE HCL 10 MG PO TABS
10.0000 mg | ORAL_TABLET | Freq: Three times a day (TID) | ORAL | Status: DC
  Administered 2017-05-01 – 2017-05-03 (×7): 10 mg via ORAL
  Filled 2017-05-01 (×7): qty 1

## 2017-05-01 MED ORDER — ACETAMINOPHEN 325 MG PO TABS
650.0000 mg | ORAL_TABLET | ORAL | Status: DC | PRN
  Administered 2017-05-02: 650 mg via ORAL
  Filled 2017-05-01: qty 2

## 2017-05-01 MED ORDER — CLINDAMYCIN PHOSPHATE 900 MG/50ML IV SOLN
INTRAVENOUS | Status: AC
  Filled 2017-05-01: qty 50

## 2017-05-01 MED ORDER — FENTANYL CITRATE (PF) 100 MCG/2ML IJ SOLN
INTRAMUSCULAR | Status: AC
  Filled 2017-05-01: qty 2

## 2017-05-01 MED ORDER — SODIUM CHLORIDE 0.9 % IV SOLN
INTRAVENOUS | Status: DC
  Administered 2017-05-01 – 2017-05-02 (×2): via INTRAVENOUS

## 2017-05-01 MED ORDER — LACTATED RINGERS IV SOLN
INTRAVENOUS | Status: DC
  Administered 2017-05-01 (×2): via INTRAVENOUS

## 2017-05-01 MED ORDER — SODIUM CHLORIDE 0.9 % IV BOLUS (SEPSIS)
500.0000 mL | Freq: Once | INTRAVENOUS | Status: AC
  Administered 2017-05-01: 500 mL via INTRAVENOUS

## 2017-05-01 MED ORDER — ALUM & MAG HYDROXIDE-SIMETH 200-200-20 MG/5ML PO SUSP: 30.0000 mL | ORAL | Status: DC | PRN

## 2017-05-01 MED ORDER — CHLORHEXIDINE GLUCONATE 4 % EX LIQD: 60.0000 mL | Freq: Once | CUTANEOUS | Status: DC

## 2017-05-01 MED ORDER — DEXAMETHASONE SODIUM PHOSPHATE 10 MG/ML IJ SOLN
INTRAMUSCULAR | Status: AC
  Filled 2017-05-01: qty 1

## 2017-05-01 MED ORDER — TETRACAINE HCL 1 % IJ SOLN
INTRAMUSCULAR | Status: DC | PRN
  Administered 2017-05-01: 3 mg via INTRASPINAL

## 2017-05-01 MED ORDER — MIDAZOLAM HCL 2 MG/2ML IJ SOLN
INTRAMUSCULAR | Status: AC
  Filled 2017-05-01: qty 2

## 2017-05-01 MED ORDER — KETAMINE HCL 50 MG/ML IJ SOLN
INTRAMUSCULAR | Status: AC
  Filled 2017-05-01: qty 10

## 2017-05-01 MED ORDER — SODIUM CHLORIDE 0.9 % IV SOLN
INTRAVENOUS | Status: DC | PRN
  Administered 2017-05-01: 60 mL

## 2017-05-01 MED ORDER — FERROUS SULFATE 325 (65 FE) MG PO TABS
325.0000 mg | ORAL_TABLET | Freq: Two times a day (BID) | ORAL | Status: DC
  Administered 2017-05-01 – 2017-05-03 (×4): 325 mg via ORAL
  Filled 2017-05-01 (×4): qty 1

## 2017-05-01 MED ORDER — ONDANSETRON HCL 4 MG PO TABS: 4.0000 mg | ORAL_TABLET | Freq: Four times a day (QID) | ORAL | Status: DC | PRN

## 2017-05-01 MED ORDER — PROPOFOL 500 MG/50ML IV EMUL
INTRAVENOUS | Status: AC
  Filled 2017-05-01: qty 50

## 2017-05-01 MED ORDER — ACETAMINOPHEN 10 MG/ML IV SOLN
INTRAVENOUS | Status: DC | PRN
  Administered 2017-05-01: 1000 mg via INTRAVENOUS

## 2017-05-01 MED ORDER — CELECOXIB 200 MG PO CAPS
200.0000 mg | ORAL_CAPSULE | Freq: Two times a day (BID) | ORAL | Status: DC
  Administered 2017-05-01 – 2017-05-03 (×4): 200 mg via ORAL
  Filled 2017-05-01 (×4): qty 1

## 2017-05-01 MED ORDER — ONDANSETRON HCL 4 MG/2ML IJ SOLN: 4.0000 mg | Freq: Once | INTRAMUSCULAR | Status: DC | PRN

## 2017-05-01 MED ORDER — TRAMADOL HCL 50 MG PO TABS
50.0000 mg | ORAL_TABLET | ORAL | Status: DC | PRN
  Administered 2017-05-01 (×2): 50 mg via ORAL
  Administered 2017-05-02: 100 mg via ORAL
  Administered 2017-05-02: 50 mg via ORAL
  Administered 2017-05-02: 100 mg via ORAL
  Administered 2017-05-02: 50 mg via ORAL
  Administered 2017-05-02: 100 mg via ORAL
  Administered 2017-05-02 – 2017-05-03 (×2): 50 mg via ORAL
  Administered 2017-05-03: 100 mg via ORAL
  Administered 2017-05-03: 50 mg via ORAL
  Filled 2017-05-01: qty 1
  Filled 2017-05-01: qty 2
  Filled 2017-05-01 (×2): qty 1
  Filled 2017-05-01: qty 2
  Filled 2017-05-01: qty 1
  Filled 2017-05-01: qty 2
  Filled 2017-05-01 (×3): qty 1
  Filled 2017-05-01: qty 2

## 2017-05-01 MED ORDER — TETRACAINE HCL 1 % IJ SOLN
INTRAMUSCULAR | Status: AC
  Filled 2017-05-01: qty 2

## 2017-05-01 MED ORDER — BUPIVACAINE HCL (PF) 0.5 % IJ SOLN
INTRAMUSCULAR | Status: DC | PRN
  Administered 2017-05-01: 2.7 mL

## 2017-05-01 MED ORDER — PHENYLEPHRINE HCL 10 MG/ML IJ SOLN
INTRAMUSCULAR | Status: AC
  Filled 2017-05-01: qty 1

## 2017-05-01 MED ORDER — TRANEXAMIC ACID 1000 MG/10ML IV SOLN
1000.0000 mg | Freq: Once | INTRAVENOUS | Status: AC
  Administered 2017-05-01: 1000 mg via INTRAVENOUS
  Filled 2017-05-01: qty 10

## 2017-05-01 MED ORDER — BUPIVACAINE LIPOSOME 1.3 % IJ SUSP
INTRAMUSCULAR | Status: AC
  Filled 2017-05-01: qty 20

## 2017-05-01 MED ORDER — FLEET ENEMA 7-19 GM/118ML RE ENEM: 1.0000 | ENEMA | Freq: Once | RECTAL | Status: DC | PRN

## 2017-05-01 MED ORDER — MENTHOL 3 MG MT LOZG
1.0000 | LOZENGE | OROMUCOSAL | Status: DC | PRN
  Filled 2017-05-01: qty 9

## 2017-05-01 MED ORDER — FENTANYL CITRATE (PF) 100 MCG/2ML IJ SOLN
INTRAMUSCULAR | Status: DC | PRN
  Administered 2017-05-01: 75 ug via INTRAVENOUS

## 2017-05-01 MED ORDER — BUPIVACAINE HCL (PF) 0.25 % IJ SOLN
INTRAMUSCULAR | Status: AC
  Filled 2017-05-01: qty 60

## 2017-05-01 MED ORDER — SODIUM CHLORIDE 0.9 % IJ SOLN
INTRAMUSCULAR | Status: AC
  Filled 2017-05-01: qty 50

## 2017-05-01 MED ORDER — PHENOL 1.4 % MT LIQD
1.0000 | OROMUCOSAL | Status: DC | PRN
  Filled 2017-05-01: qty 177

## 2017-05-01 MED ORDER — PROPOFOL 500 MG/50ML IV EMUL
INTRAVENOUS | Status: DC | PRN
  Administered 2017-05-01: 65 ug/kg/min via INTRAVENOUS

## 2017-05-01 MED ORDER — DEXAMETHASONE SODIUM PHOSPHATE 4 MG/ML IJ SOLN
INTRAMUSCULAR | Status: DC | PRN
  Administered 2017-05-01: 6 mg via INTRAVENOUS

## 2017-05-01 MED ORDER — SODIUM CHLORIDE 0.9 % IV SOLN
INTRAVENOUS | Status: DC | PRN
  Administered 2017-05-01: 30 ug/min via INTRAVENOUS

## 2017-05-01 MED ORDER — GLYCOPYRROLATE 0.2 MG/ML IJ SOLN
INTRAMUSCULAR | Status: DC | PRN
  Administered 2017-05-01: 0.2 mg via INTRAVENOUS

## 2017-05-01 MED ORDER — PROPOFOL 10 MG/ML IV BOLUS
INTRAVENOUS | Status: DC | PRN
  Administered 2017-05-01: 16 mg via INTRAVENOUS
  Administered 2017-05-01: 30 mg via INTRAVENOUS
  Administered 2017-05-01: 16 mg via INTRAVENOUS

## 2017-05-01 MED ORDER — IRBESARTAN 150 MG PO TABS
300.0000 mg | ORAL_TABLET | Freq: Every day | ORAL | Status: DC
  Administered 2017-05-02 – 2017-05-03 (×2): 300 mg via ORAL
  Filled 2017-05-01 (×2): qty 2

## 2017-05-01 MED ORDER — KETAMINE HCL 50 MG/ML IJ SOLN
INTRAMUSCULAR | Status: DC | PRN
  Administered 2017-05-01 (×3): 25 mg via INTRAMUSCULAR

## 2017-05-01 MED ORDER — SENNOSIDES-DOCUSATE SODIUM 8.6-50 MG PO TABS
1.0000 | ORAL_TABLET | Freq: Two times a day (BID) | ORAL | Status: DC
  Administered 2017-05-01 – 2017-05-03 (×4): 1 via ORAL
  Filled 2017-05-01 (×4): qty 1

## 2017-05-01 MED ORDER — DIPHENHYDRAMINE HCL 12.5 MG/5ML PO ELIX: 12.5000 mg | ORAL_SOLUTION | ORAL | Status: DC | PRN

## 2017-05-01 MED ORDER — ACETAMINOPHEN 10 MG/ML IV SOLN
1000.0000 mg | Freq: Four times a day (QID) | INTRAVENOUS | Status: AC
  Administered 2017-05-01 – 2017-05-02 (×4): 1000 mg via INTRAVENOUS
  Filled 2017-05-01 (×4): qty 100

## 2017-05-01 MED ORDER — CLINDAMYCIN PHOSPHATE 600 MG/50ML IV SOLN
600.0000 mg | Freq: Four times a day (QID) | INTRAVENOUS | Status: AC
  Administered 2017-05-01 – 2017-05-02 (×4): 600 mg via INTRAVENOUS
  Filled 2017-05-01 (×4): qty 50

## 2017-05-01 MED ORDER — BUPIVACAINE HCL (PF) 0.25 % IJ SOLN
INTRAMUSCULAR | Status: DC | PRN
  Administered 2017-05-01: 60 mL

## 2017-05-01 MED ORDER — ONDANSETRON HCL 4 MG/2ML IJ SOLN
INTRAMUSCULAR | Status: AC
  Filled 2017-05-01: qty 2

## 2017-05-01 MED ORDER — OXYCODONE HCL 5 MG PO TABS
10.0000 mg | ORAL_TABLET | ORAL | Status: DC | PRN
  Administered 2017-05-01: 10 mg via ORAL
  Filled 2017-05-01: qty 2

## 2017-05-01 MED ORDER — NEOMYCIN-POLYMYXIN B GU 40-200000 IR SOLN
Status: DC | PRN
  Administered 2017-05-01: 14 mL

## 2017-05-01 MED ORDER — MORPHINE SULFATE (PF) 2 MG/ML IV SOLN: 2.0000 mg | INTRAVENOUS | Status: DC | PRN

## 2017-05-01 MED ORDER — GLYCOPYRROLATE 0.2 MG/ML IJ SOLN
INTRAMUSCULAR | Status: AC
  Filled 2017-05-01: qty 1

## 2017-05-01 MED ORDER — MIDAZOLAM HCL 5 MG/5ML IJ SOLN
INTRAMUSCULAR | Status: DC | PRN
  Administered 2017-05-01 (×2): 1 mg via INTRAVENOUS

## 2017-05-01 MED ORDER — ONDANSETRON HCL 4 MG/2ML IJ SOLN
4.0000 mg | Freq: Four times a day (QID) | INTRAMUSCULAR | Status: DC | PRN
  Administered 2017-05-01: 4 mg via INTRAVENOUS
  Filled 2017-05-01: qty 2

## 2017-05-01 SURGICAL SUPPLY — 66 items
BATTERY INSTRU NAVIGATION (MISCELLANEOUS) ×12 IMPLANT
BLADE CLIPPER SURG (BLADE) ×3 IMPLANT
BLADE SAW 1 (BLADE) ×3 IMPLANT
BLADE SAW 1/2 (BLADE) ×3 IMPLANT
BLADE SAW 70X12.5 (BLADE) IMPLANT
CANISTER SUCT 1200ML W/VALVE (MISCELLANEOUS) ×3 IMPLANT
CANISTER SUCT 3000ML PPV (MISCELLANEOUS) ×6 IMPLANT
CAPT KNEE TOTAL 3 ATTUNE ×3 IMPLANT
CEMENT HV SMART SET (Cement) ×6 IMPLANT
COOLER POLAR GLACIER W/PUMP (MISCELLANEOUS) ×3 IMPLANT
CUFF TOURN 24 STER (MISCELLANEOUS) ×3 IMPLANT
CUFF TOURN 30 STER DUAL PORT (MISCELLANEOUS) IMPLANT
DRAPE SHEET LG 3/4 BI-LAMINATE (DRAPES) ×3 IMPLANT
DRSG DERMACEA 8X12 NADH (GAUZE/BANDAGES/DRESSINGS) ×3 IMPLANT
DRSG OPSITE POSTOP 4X14 (GAUZE/BANDAGES/DRESSINGS) ×3 IMPLANT
DRSG TEGADERM 4X4.75 (GAUZE/BANDAGES/DRESSINGS) ×3 IMPLANT
DURAPREP 26ML APPLICATOR (WOUND CARE) ×6 IMPLANT
ELECT CAUTERY BLADE 6.4 (BLADE) ×3 IMPLANT
ELECT REM PT RETURN 9FT ADLT (ELECTROSURGICAL) ×3
ELECTRODE REM PT RTRN 9FT ADLT (ELECTROSURGICAL) ×1 IMPLANT
EVACUATOR 1/8 PVC DRAIN (DRAIN) ×3 IMPLANT
EX-PIN ORTHOLOCK NAV 4X150 (PIN) ×6 IMPLANT
GLOVE BIOGEL M STRL SZ7.5 (GLOVE) ×6 IMPLANT
GLOVE BIOGEL PI IND STRL 7.5 (GLOVE) ×2 IMPLANT
GLOVE BIOGEL PI IND STRL 9 (GLOVE) ×1 IMPLANT
GLOVE BIOGEL PI INDICATOR 7.5 (GLOVE) ×4
GLOVE BIOGEL PI INDICATOR 9 (GLOVE) ×2
GLOVE INDICATOR 8.0 STRL GRN (GLOVE) ×3 IMPLANT
GLOVE SURG SYN 9.0  PF PI (GLOVE) ×2
GLOVE SURG SYN 9.0 PF PI (GLOVE) ×1 IMPLANT
GOWN STRL REUS W/ TWL LRG LVL3 (GOWN DISPOSABLE) ×3 IMPLANT
GOWN STRL REUS W/TWL 2XL LVL3 (GOWN DISPOSABLE) ×3 IMPLANT
GOWN STRL REUS W/TWL LRG LVL3 (GOWN DISPOSABLE) ×6
HOLDER FOLEY CATH W/STRAP (MISCELLANEOUS) ×3 IMPLANT
HOOD PEEL AWAY FLYTE STAYCOOL (MISCELLANEOUS) ×6 IMPLANT
KIT RM TURNOVER STRD PROC AR (KITS) ×3 IMPLANT
KNIFE SCULPS 14X20 (INSTRUMENTS) ×3 IMPLANT
LABEL OR SOLS (LABEL) ×3 IMPLANT
NDL SAFETY ECLIPSE 18X1.5 (NEEDLE) ×1 IMPLANT
NEEDLE HYPO 18GX1.5 SHARP (NEEDLE) ×2
NEEDLE SPNL 20GX3.5 QUINCKE YW (NEEDLE) ×6 IMPLANT
NS IRRIG 500ML POUR BTL (IV SOLUTION) ×3 IMPLANT
PACK TOTAL KNEE (MISCELLANEOUS) ×3 IMPLANT
PAD WRAPON POLAR KNEE (MISCELLANEOUS) ×1 IMPLANT
PIN DRILL QUICK PACK ×3 IMPLANT
PIN FIXATION 1/8DIA X 3INL (PIN) ×3 IMPLANT
PULSAVAC PLUS IRRIG FAN TIP (DISPOSABLE) ×3
SOL .9 NS 3000ML IRR  AL (IV SOLUTION) ×2
SOL .9 NS 3000ML IRR UROMATIC (IV SOLUTION) ×1 IMPLANT
SOL PREP PVP 2OZ (MISCELLANEOUS) ×3
SOLUTION PREP PVP 2OZ (MISCELLANEOUS) ×1 IMPLANT
SPONGE DRAIN TRACH 4X4 STRL 2S (GAUZE/BANDAGES/DRESSINGS) ×3 IMPLANT
STAPLER SKIN PROX 35W (STAPLE) ×3 IMPLANT
STRAP TIBIA SHORT (MISCELLANEOUS) ×3 IMPLANT
SUCTION FRAZIER HANDLE 10FR (MISCELLANEOUS) ×2
SUCTION TUBE FRAZIER 10FR DISP (MISCELLANEOUS) ×1 IMPLANT
SUT VIC AB 0 CT1 36 (SUTURE) ×3 IMPLANT
SUT VIC AB 1 CT1 36 (SUTURE) ×6 IMPLANT
SUT VIC AB 2-0 CT2 27 (SUTURE) ×3 IMPLANT
SYR 20CC LL (SYRINGE) ×3 IMPLANT
SYR 30ML LL (SYRINGE) ×6 IMPLANT
TIP FAN IRRIG PULSAVAC PLUS (DISPOSABLE) ×1 IMPLANT
TOWEL OR 17X26 4PK STRL BLUE (TOWEL DISPOSABLE) ×3 IMPLANT
TOWER CARTRIDGE SMART MIX (DISPOSABLE) ×3 IMPLANT
TRAY FOLEY W/METER SILVER 16FR (SET/KITS/TRAYS/PACK) ×3 IMPLANT
WRAPON POLAR PAD KNEE (MISCELLANEOUS) ×3

## 2017-05-01 NOTE — Anesthesia Procedure Notes (Signed)
Spinal  Patient location during procedure: OR Start time: 05/01/2017 7:23 AM End time: 05/01/2017 7:28 AM Staffing Performed: resident/CRNA  Preanesthetic Checklist Completed: patient identified, site marked, surgical consent, pre-op evaluation, timeout performed, IV checked, risks and benefits discussed and monitors and equipment checked Spinal Block Patient position: sitting Prep: ChloraPrep Patient monitoring: heart rate, continuous pulse ox, blood pressure and cardiac monitor Approach: midline Location: L4-5 Injection technique: single-shot Needle Needle type: Introducer and Pencan  Needle gauge: 24 G Needle length: 9 cm Additional Notes Negative paresthesia. Negative blood return. Positive free-flowing CSF. Expiration date of kit checked and confirmed. Patient tolerated procedure well, without complications.

## 2017-05-01 NOTE — Evaluation (Signed)
Physical Therapy Evaluation Patient Details Name: Bryan Santiago MRN: [id number] DOB: 06/13/1950 Today's Date: 05/01/2017   History of Present Illness  Pt admitted for R TKR.   Clinical Impression  Pt is a pleasant 66 year old male who was admitted for R TKR. Pt performs bed mobility, transfers, and ambulation with cga and RW. Ambulation limited secondary to dizziness symptoms and decreased BP to 74/43 with ambulation to recliner. Pt also nauseated, given ginger ale and cold towel. RN notified. Pt demonstrates ability to perform 10 SLRs with independence, therefore does not require KI for mobility. Pt demonstrates deficits with strength/ROM/mobility. Pt appears to be very motivated to perform therapy. Would benefit from skilled PT to address above deficits and promote optimal return to PLOF. Recommend transition to [place] upon discharge from acute hospitalization.       Follow Up Recommendations Home health PT    Equipment Recommendations  Rolling walker with 5" wheels;3in1 (PT)    Recommendations for Other Services       Precautions / Restrictions Precautions Precautions: Knee;Fall Precaution Booklet Issued: No Restrictions Weight Bearing Restrictions: Yes RLE Weight Bearing: Weight bearing as tolerated      Mobility  Bed Mobility Overal bed mobility: Needs Assistance Bed Mobility: Supine to Sit     Supine to sit: Min guard     General bed mobility comments: safe technique with pt able to sit at EOB with ease  Transfers Overall transfer level: Needs assistance Equipment used: Rolling walker (2 wheeled) Transfers: Sit to/from Stand Sit to Stand: Min guard         General transfer comment: Prior to standing, pt felt slightly dizzy. Symptoms resolved quickly and pt able to stand with RW and safe technique. Cues given for correct placement.   Ambulation/Gait Ambulation/Gait assistance: Min guard Ambulation Distance (Feet): 3 Feet Assistive device: Rolling walker (2  wheeled) Gait Pattern/deviations: Step-to pattern     General Gait Details: ambulated towards recliner using RW. Pt became dizzy a second time. Vitals taken; BP at 74/43. Further ambulation deferred. Pt also became nauseous with exertion  Stairs            Wheelchair Mobility    Modified Rankin (Stroke Patients Only)       Balance Overall balance assessment: Needs assistance Sitting-balance support: Feet supported Sitting balance-Leahy Scale: Normal     Standing balance support: Bilateral upper extremity supported Standing balance-Leahy Scale: Good                               Pertinent Vitals/Pain Pain Assessment: 0-10 Pain Score: 4  Pain Location: R knee Pain Descriptors / Indicators: Operative site guarding Pain Intervention(s): Limited activity within patient's tolerance;Premedicated before session;Ice applied    Home Living Family/patient expects to be discharged to:: Private residence Living Arrangements: Spouse/significant other Available Help at Discharge: Family Type of Home: House Home Access: Stairs to enter Entrance Stairs-Rails: Can reach both Entrance Stairs-Number of Steps: 3 Home Layout: Two level;Bed/bath upstairs(plans to stay downstairs for a few days) Home Equipment: None      Prior Function Level of Independence: Independent               Hand Dominance        Extremity/Trunk Assessment   Upper Extremity Assessment Upper Extremity Assessment: Overall WFL for tasks assessed    Lower Extremity Assessment Lower Extremity Assessment: Generalized weakness(R LE grossly 3/5; L LE grossly 5/5)  Communication   Communication: No difficulties  Cognition Arousal/Alertness: Awake/alert Behavior During Therapy: WFL for tasks assessed/performed Overall Cognitive Status: Within Functional Limits for tasks assessed                                        General Comments      Exercises Total  Joint Exercises Goniometric ROM: R knee AAROM 3 degrees of extension. Unable to test flexion secondary to medical status Other Exercises Other Exercises: supine ther-ex performed on R LE including ankle pumps, quad sets, SLRs, and hip abd/add x 10 reps with cga. Safe technique performed.   Assessment/Plan    PT Assessment Patient needs continued PT services  PT Problem List Decreased strength;Decreased range of motion;Decreased activity tolerance;Decreased mobility;Pain;Decreased knowledge of use of DME       PT Treatment Interventions DME instruction;Gait training;Stair training;Therapeutic exercise    PT Goals (Current goals can be found in the Care Plan section)  Acute Rehab PT Goals Patient Stated Goal: to get stronger PT Goal Formulation: With patient Time For Goal Achievement: 05/15/17 Potential to Achieve Goals: Good    Frequency BID   Barriers to discharge        Co-evaluation               AM-PAC PT "6 Clicks" Daily Activity  Outcome Measure Difficulty turning over in bed (including adjusting bedclothes, sheets and blankets)?: Unable Difficulty moving from lying on back to sitting on the side of the bed? : Unable Difficulty sitting down on and standing up from a chair with arms (e.g., wheelchair, bedside commode, etc,.)?: Unable Help needed moving to and from a bed to chair (including a wheelchair)?: A Little Help needed walking in hospital room?: A Little Help needed climbing 3-5 steps with a railing? : A Lot 6 Click Score: 11    End of Session Equipment Utilized During Treatment: Gait belt Activity Tolerance: Treatment limited secondary to medical complications (Comment) Patient left: in chair;with chair alarm set Nurse Communication: Mobility status PT Visit Diagnosis: Muscle weakness (generalized) (M62.81);Difficulty in walking, not elsewhere classified (R26.2);Pain Pain - Right/Left: Right Pain - part of body: Knee    Time: 1530-1602 PT Time  Calculation (min) (ACUTE ONLY): 32 min   Charges:   PT Evaluation $PT Eval Low Complexity: 1 Low PT Treatments $Therapeutic Exercise: 8-22 mins   PT G Codes:   PT G-Codes **NOT FOR INPATIENT CLASS** Functional Assessment Tool Used: AM-PAC 6 Clicks Basic Mobility Functional Limitation: Mobility: Walking and moving around Mobility: Walking and Moving Around Current Status (G9211): At least 60 percent but less than 80 percent impaired, limited or restricted Mobility: Walking and Moving Around Goal Status [phone]): At least 40 percent but less than 60 percent impaired, limited or restricted    Greggory Stallion, PT, DPT [phone]   Kentrell Guettler 05/01/2017, 5:01 PM

## 2017-05-01 NOTE — Progress Notes (Signed)
BP 138/71 after bolus

## 2017-05-01 NOTE — H&P (Signed)
The patient has been re-examined, and the chart reviewed, and there have been no interval changes to the documented history and physical.    The risks, benefits, and alternatives have been discussed at length. The patient expressed understanding of the risks benefits and agreed with plans for surgical intervention.  Assad P. Hooten, Jr. M.D.    

## 2017-05-01 NOTE — Anesthesia Preprocedure Evaluation (Signed)
Anesthesia Evaluation  Patient identified by MRN, date of birth, ID band Patient awake    Reviewed: Allergy & Precautions, NPO status , Patient's Chart, lab work & pertinent test results, reviewed documented beta blocker date and time   Airway Mallampati: II  TM Distance: >3 FB     Dental  (+) Chipped, Upper Dentures   Pulmonary former smoker,           Cardiovascular hypertension, Pt. on medications      Neuro/Psych    GI/Hepatic GERD  Controlled,  Endo/Other    Renal/GU      Musculoskeletal  (+) Arthritis ,   Abdominal   Peds  Hematology   Anesthesia Other Findings   Reproductive/Obstetrics                             Anesthesia Physical Anesthesia Plan  ASA: III  Anesthesia Plan: Spinal   Post-op Pain Management:    Induction:   PONV Risk Score and Plan:   Airway Management Planned:   Additional Equipment:   Intra-op Plan:   Post-operative Plan:   Informed Consent: I have reviewed the patients History and Physical, chart, labs and discussed the procedure including the risks, benefits and alternatives for the proposed anesthesia with the patient or authorized representative who has indicated his/her understanding and acceptance.     Plan Discussed with: CRNA  Anesthesia Plan Comments:         Anesthesia Quick Evaluation

## 2017-05-01 NOTE — Anesthesia Post-op Follow-up Note (Signed)
Anesthesia QCDR form completed.        

## 2017-05-01 NOTE — NC FL2 (Signed)
[place] LEVEL OF CARE SCREENING TOOL     IDENTIFICATION  Patient Name: Bryan Santiago Birthdate: Aug 05, 1950 Sex: male Admission Date (Current Location): 05/01/2017  [place] and [place] Number:  Engineering geologist and Address:  [facility], [address], [place], [place] [address]      Provider Number: [id number]  Attending Physician Name and Address:  Dereck Leep, MD  Relative Name and Phone Number:       Current Level of Care: Hospital Recommended Level of Care: [place] Prior Approval Number:    Date Approved/Denied:   PASRR Number: [id number])  Discharge Plan: SNF    Current Diagnoses: Patient Active Problem List   Diagnosis Date Noted  . S/P total knee arthroplasty 05/01/2017  . Primary osteoarthritis of right knee 12/23/2016  . Chest pain with moderate risk for cardiac etiology 11/19/2016  . Essential hypertension 11/19/2016  . HLD (hyperlipidemia) 11/19/2016  . B12 deficiency 10/15/2016    Orientation RESPIRATION BLADDER Height & Weight     Self, Time, Situation, Place  Normal Continent Weight:   Height:     BEHAVIORAL SYMPTOMS/MOOD NEUROLOGICAL BOWEL NUTRITION STATUS      Continent Diet(Diet: Clear Liquid to be Advanced. )  AMBULATORY STATUS COMMUNICATION OF NEEDS Skin   Extensive Assist Verbally Surgical wounds(Incision: Left Knee. )                       Personal Care Assistance Level of Assistance  Bathing, Feeding, Dressing Bathing Assistance: Limited assistance Feeding assistance: Independent Dressing Assistance: Limited assistance     Functional Limitations Info  Sight, Hearing, Speech Sight Info: Adequate Hearing Info: Adequate Speech Info: Adequate    SPECIAL CARE FACTORS FREQUENCY  PT (By licensed PT), OT (By licensed OT)     PT Frequency: (5) OT Frequency: (5)            Contractures      Additional Factors Info  Code Status, Allergies Code Status Info:  (Full Code. ) Allergies Info: (Ace Inhibitors, Mucinex Guaifenesin Er, Penicillins, Quinolones)           Current Medications (05/01/2017):  This is the current hospital active medication list Current Facility-Administered Medications  Medication Dose Route Frequency Provider Last Rate Last Dose  . 0.9 %  sodium chloride infusion   Intravenous Continuous Hooten, Laurice Record, MD 100 mL/hr at 05/01/17 1245    . acetaminophen (OFIRMEV) IV 1,000 mg  1,000 mg Intravenous Q6H Hooten, Laurice Record, MD   Stopped at 05/01/17 1531  . acetaminophen (TYLENOL) tablet 650 mg  650 mg Oral Q4H PRN Hooten, Laurice Record, MD       Or  . acetaminophen (TYLENOL) suppository 650 mg  650 mg Rectal Q4H PRN Hooten, Laurice Record, MD      . alum & mag hydroxide-simeth (MAALOX/MYLANTA) 200-200-20 MG/5ML suspension 30 mL  30 mL Oral Q4H PRN Hooten, Laurice Record, MD      . amLODipine (NORVASC) tablet 5 mg  5 mg Oral BID Hooten, Laurice Record, MD      . bisacodyl (DULCOLAX) suppository 10 mg  10 mg Rectal Daily PRN Hooten, Laurice Record, MD      . celecoxib (CELEBREX) capsule 200 mg  200 mg Oral Q12H Hooten, Laurice Record, MD      . clindamycin (CLEOCIN) IVPB 600 mg  600 mg Intravenous Q6H Hooten, Laurice Record, MD   Stopped at 05/01/17 1618  . diphenhydrAMINE (BENADRYL) 12.5  MG/5ML elixir 12.5-25 mg  12.5-25 mg Oral Q4H PRN Hooten, Laurice Record, MD      . Derrill Memo ON 05/02/2017] enoxaparin (LOVENOX) injection 30 mg  30 mg Subcutaneous Q12H Hooten, Laurice Record, MD      . ferrous sulfate tablet 325 mg  325 mg Oral BID WC Hooten, Laurice Record, MD      . irbesartan (AVAPRO) tablet 300 mg  300 mg Oral Daily Hooten, Laurice Record, MD      . magnesium hydroxide (MILK OF MAGNESIA) suspension 30 mL  30 mL Oral Daily PRN Hooten, Laurice Record, MD      . menthol-cetylpyridinium (CEPACOL) lozenge 3 mg  1 lozenge Oral PRN Hooten, Laurice Record, MD       Or  . phenol (CHLORASEPTIC) mouth spray 1 spray  1 spray Mouth/Throat PRN Hooten, Laurice Record, MD      . metoCLOPramide (REGLAN) tablet 10 mg  10 mg Oral TID AC &  HS Hooten, Laurice Record, MD      . morphine 2 MG/ML injection 2 mg  2 mg Intravenous Q2H PRN Hooten, Laurice Record, MD      . ondansetron (ZOFRAN) tablet 4 mg  4 mg Oral Q6H PRN Hooten, Laurice Record, MD       Or  . ondansetron (ZOFRAN) injection 4 mg  4 mg Intravenous Q6H PRN Hooten, Laurice Record, MD   4 mg at 05/01/17 1407  . oxyCODONE (Oxy IR/ROXICODONE) immediate release tablet 10 mg  10 mg Oral Q3H PRN Dereck Leep, MD   10 mg at 05/01/17 1322  . oxyCODONE (Oxy IR/ROXICODONE) immediate release tablet 5 mg  5 mg Oral Q3H PRN Hooten, Laurice Record, MD      . pantoprazole (PROTONIX) EC tablet 40 mg  40 mg Oral BID Hooten, Laurice Record, MD      . senna-docusate (Senokot-S) tablet 1 tablet  1 tablet Oral BID Hooten, Laurice Record, MD      . Derrill Memo ON 05/02/2017] simvastatin (ZOCOR) tablet 40 mg  40 mg Oral BH-q7a Hooten, Laurice Record, MD      . sodium phosphate (FLEET) 7-19 GM/118ML enema 1 enema  1 enema Rectal Once PRN Hooten, Laurice Record, MD      . traMADol Veatrice Bourbon) tablet 50-100 mg  50-100 mg Oral Q4H PRN Dereck Leep, MD   50 mg at 05/01/17 1540     Discharge Medications: Please see discharge summary for a list of discharge medications.  Relevant Imaging Results:  Relevant Lab Results:   Additional Information (SSN: [id number])  Adonica Fukushima, Veronia Beets, LCSW

## 2017-05-01 NOTE — Discharge Instructions (Signed)
°  Instructions after Total Knee Replacement ° ° Eward P. Hanley Rispoli, Jr., M.D.    ° Dept. of Orthopaedics & Sports Medicine ° [facility] ° [address] ° [place], [place]  [address] ° Phone: [phone]   Fax: [phone] ° °  °DIET: °• Drink plenty of non-alcoholic fluids. °• Resume your normal diet. Include foods high in fiber. ° °ACTIVITY:  °• You may use crutches or a walker with weight-bearing as tolerated, unless instructed otherwise. °• You may be weaned off of the walker or crutches by your Physical Therapist.  °• Do NOT place pillows under the knee. Anything placed under the knee could limit your ability to straighten the knee.   °• Continue doing gentle exercises. Exercising will reduce the pain and swelling, increase motion, and prevent muscle weakness.   °• Please continue to use the TED compression stockings for 6 weeks. You may remove the stockings at night, but should reapply them in the morning. °• Do not drive or operate any equipment until instructed. ° °WOUND CARE:  °• Continue to use the PolarCare or ice packs periodically to reduce pain and swelling. °• You may bathe or shower after the staples are removed at the first office visit following surgery. ° °MEDICATIONS: °• You may resume your regular medications. °• Please take the pain medication as prescribed on the medication. °• Do not take pain medication on an empty stomach. °• You have been given a prescription for a blood thinner (Lovenox or Coumadin). Please take the medication as instructed. (NOTE: After completing a 2 week course of Lovenox, take one Enteric-coated aspirin once a day. This along with elevation will help reduce the possibility of phlebitis in your operated leg.) °• Do not drive or drink alcoholic beverages when taking pain medications. ° °CALL THE OFFICE FOR: °• Temperature above 101 degrees °• Excessive bleeding or drainage on the dressing. °• Excessive swelling, coldness, or paleness of the toes. °• Persistent  nausea and vomiting. ° °FOLLOW-UP:  °• You should have an appointment to return to the office in 10-14 days after surgery. °• Arrangements have been made for continuation of Physical Therapy (either home therapy or outpatient therapy). °  °

## 2017-05-01 NOTE — Op Note (Signed)
OPERATIVE NOTE  DATE OF SURGERY:  05/01/2017  PATIENT NAME:  Bryan Santiago   DOB: 11-07-1950  MRN: [id number]  PRE-OPERATIVE DIAGNOSIS: Degenerative arthrosis of the right knee, primary  POST-OPERATIVE DIAGNOSIS:  Same  PROCEDURE:  Right total knee arthroplasty using computer-assisted navigation  SURGEON:  Marciano Sequin. M.D.  ASSISTANT:  Vance Peper, PA (present and scrubbed throughout the case, critical for assistance with exposure, retraction, instrumentation, and closure)  ANESTHESIA: spinal  ESTIMATED BLOOD LOSS: 50 mL  FLUIDS REPLACED: 1350 mL of crystalloid  TOURNIQUET TIME: 86 minutes  DRAINS: 2 medium Hemovac drains  SOFT TISSUE RELEASES: Anterior cruciate ligament, posterior cruciate ligament, deep medial collateral ligament, patellofemoral ligament  IMPLANTS UTILIZED: DePuy Attune size 7 posterior stabilized femoral component (cemented), size 8 rotating platform tibial component (cemented), 41 mm medialized dome patella (cemented), and a 7 mm stabilized rotating platform polyethylene insert.  INDICATIONS FOR SURGERY: Bryan Santiago is a 66 y.o. year old male with a long history of progressive knee pain. X-rays demonstrated severe degenerative changes in tricompartmental fashion. The patient had not seen any significant improvement despite conservative nonsurgical intervention. After discussion of the risks and benefits of surgical intervention, the patient expressed understanding of the risks benefits and agree with plans for total knee arthroplasty.   The risks, benefits, and alternatives were discussed at length including but not limited to the risks of infection, bleeding, nerve injury, stiffness, blood clots, the need for revision surgery, cardiopulmonary complications, among others, and they were willing to proceed.  PROCEDURE IN DETAIL: The patient was brought into the operating room and, after adequate spinal anesthesia was achieved, a tourniquet was placed on the  patient's upper thigh. The patient's knee and leg were cleaned and prepped with alcohol and DuraPrep and draped in the usual sterile fashion. A "timeout" was performed as per usual protocol. The lower extremity was exsanguinated using an Esmarch, and the tourniquet was inflated to 300 mmHg. An anterior longitudinal incision was made followed by a standard mid vastus approach. The deep fibers of the medial collateral ligament were elevated in a subperiosteal fashion off of the medial flare of the tibia so as to maintain a continuous soft tissue sleeve. The patella was subluxed laterally and the patellofemoral ligament was incised. Inspection of the knee demonstrated severe degenerative changes with full-thickness loss of articular cartilage. Osteophytes were debrided using a rongeur. Anterior and posterior cruciate ligaments were excised. Two 4.0 mm Schanz pins were inserted in the femur and into the tibia for attachment of the array of trackers used for computer-assisted navigation. Hip center was identified using a circumduction technique. Distal landmarks were mapped using the computer. The distal femur and proximal tibia were mapped using the computer. The distal femoral cutting guide was positioned using computer-assisted navigation so as to achieve a 5 distal valgus cut. The femur was sized and it was felt that a size 7 femoral component was appropriate. A size 7 femoral cutting guide was positioned and the anterior cut was performed and verified using the computer. This was followed by completion of the posterior and chamfer cuts. Femoral cutting guide for the central box was then positioned in the center box cut was performed.  Attention was then directed to the proximal tibia. Medial and lateral menisci were excised. The extramedullary tibial cutting guide was positioned using computer-assisted navigation so as to achieve a 0 varus-valgus alignment and 3 posterior slope. The cut was performed and  verified using the computer. The proximal tibia  was sized and it was felt that a size 8 tibial tray was appropriate. Tibial and femoral trials were inserted followed by insertion of a 7 mm polyethylene insert. This allowed for excellent mediolateral soft tissue balancing both in flexion and in full extension. Finally, the patella was cut and prepared so as to accommodate a 41 mm medialized dome patella. A patella trial was placed and the knee was placed through a range of motion with excellent patellar tracking appreciated. The femoral trial was removed after debridement of posterior osteophytes. The central post-hole for the tibial component was reamed followed by insertion of a keel punch. Tibial trials were then removed. Cut surfaces of bone were irrigated with copious amounts of normal saline with antibiotic solution using pulsatile lavage and then suctioned dry. Polymethylmethacrylate cement was prepared in the usual fashion using a vacuum mixer. Cement was applied to the cut surface of the proximal tibia as well as along the undersurface of a size 8 rotating platform tibial component. Tibial component was positioned and impacted into place. Excess cement was removed using [employer] fast streamer. Cement was then applied to the cut surfaces of the femur as well as along the posterior flanges of the size 7 femoral component. The femoral component was positioned and impacted into place. Excess cement was removed using [employer] fast streamer. A 7 mm polyethylene trial was inserted and the knee was brought into full extension with steady axial compression applied. Finally, cement was applied to the backside of a 41 mm medialized dome patella and the patellar component was positioned and patellar clamp applied. Excess cement was removed using [employer] fast streamer. After adequate curing of the cement, the tourniquet was deflated after a total tourniquet time of 86 minutes. Hemostasis was achieved using electrocautery. The knee was  irrigated with copious amounts of normal saline with antibiotic solution using pulsatile lavage and then suctioned dry. 20 mL of 1.3% Exparel and 60 mL of 0.25% Marcaine in 40 mL of normal saline was injected along the posterior capsule, medial and lateral gutters, and along the arthrotomy site. A 7 mm stabilized rotating platform polyethylene insert was inserted and the knee was placed through a range of motion with excellent mediolateral soft tissue balancing appreciated and excellent patellar tracking noted. 2 medium drains were placed in the wound bed and brought out through separate stab incisions. The medial parapatellar portion of the incision was reapproximated using interrupted sutures of #1 Vicryl. Subcutaneous tissue was approximated in layers using first #0 Vicryl followed #2-0 Vicryl. The skin was approximated with skin staples. A sterile dressing was applied.  The patient tolerated the procedure well and was transported to the recovery room in stable condition.    Kahleel P. Holley Bouche., M.D.

## 2017-05-01 NOTE — Progress Notes (Signed)
BP 73/42. Pt symptomatic: clammy skin, nauseous and pale. MD Hooten made aware and received verbal orders for 500 ml bolus.

## 2017-05-01 NOTE — Transfer of Care (Signed)
Immediate Anesthesia Transfer of Care Note  Patient: Bryan Santiago  Procedure(s) Performed: COMPUTER ASSISTED TOTAL KNEE ARTHROPLASTY (Right Knee)  Patient Location: PACU  Anesthesia Type:Spinal  Level of Consciousness: awake, alert , oriented and patient cooperative  Airway & Oxygen Therapy: Patient Spontanous Breathing and Patient connected to nasal cannula oxygen  Post-op Assessment: Report given to RN and Post -op Vital signs reviewed and stable  Post vital signs: Reviewed and stable  Last Vitals:  Vitals:   05/01/17 0609  BP: (!) 154/88  Pulse: 78  Resp: 18  Temp: (!) 36.2 C  SpO2: 96%    Last Pain:  Vitals:   05/01/17 0609  TempSrc: Tympanic  PainSc: 4          Complications: No apparent anesthesia complications

## 2017-05-02 MED ORDER — OXYCODONE HCL 5 MG PO TABS: 5.0000 mg | ORAL_TABLET | ORAL | 0 refills | Status: DC | PRN

## 2017-05-02 MED ORDER — ENOXAPARIN SODIUM 40 MG/0.4ML ~~LOC~~ SOLN: 40.0000 mg | SUBCUTANEOUS | 0 refills | Status: DC

## 2017-05-02 MED ORDER — TRAMADOL HCL 50 MG PO TABS: 50.0000 mg | ORAL_TABLET | ORAL | 0 refills | Status: DC | PRN

## 2017-05-02 NOTE — Progress Notes (Signed)
Physical Therapy Treatment Patient Details Name: Bryan Santiago MRN: [id number] DOB: 09/19/1950 Today's Date: 05/02/2017    History of Present Illness Pt. is a 66 y.o. male who was admitted to [facility] for a Right TKR. Pt. PMHx includes: OA of the Rigiht knee, Chest pain with moderate risk of cardiadc Etiology, Essential HTN, HLD, and B12 Deficiency.    PT Comments    Pt is making good progress towards goals with ability to safely navigate RN station this session. RW adjusted to pt height and improved gait sequencing noted. Stair training performed this date with safe technique. Slight increased pain noted with mobility. Good endurance with there-ex. Pt continues to be motivated to perform therapy. Will continue to progress.   Follow Up Recommendations  Home health PT     Equipment Recommendations  Rolling walker with 5" wheels;3in1 (PT)    Recommendations for Other Services       Precautions / Restrictions Precautions Precautions: Knee;Fall Precaution Booklet Issued: Yes (comment) Restrictions Weight Bearing Restrictions: Yes RLE Weight Bearing: Weight bearing as tolerated    Mobility  Bed Mobility               General bed mobility comments: not performed as pt received in chair  Transfers Overall transfer level: Needs assistance Equipment used: Rolling walker (2 wheeled) Transfers: Sit to/from Stand Sit to Stand: Supervision         General transfer comment: safe technique performed with RW. Upright posture noted  Ambulation/Gait Ambulation/Gait assistance: Supervision Ambulation Distance (Feet): 250 Feet Assistive device: Rolling walker (2 wheeled) Gait Pattern/deviations: Step-through pattern     General Gait Details: ambulated with improved gait sequencing including reciprocal gait and symmetrical step length. Able to demonstrate upright posture and no dizziness or fatigue present during mobility efforts.   Stairs Stairs: Yes   Stair Management: One  rail Right;Step to pattern Number of Stairs: 4 General stair comments: Pt safely navigated 4 stairs with R rail asending and L rail descending. Therapist explained and demonstrated prior to performance.   Wheelchair Mobility    Modified Rankin (Stroke Patients Only)       Balance                                            Cognition Arousal/Alertness: Awake/alert Behavior During Therapy: WFL for tasks assessed/performed Overall Cognitive Status: Within Functional Limits for tasks assessed                                        Exercises Other Exercises Other Exercises: seated ther-ex performed on R LE including ankle pumps, quad sets, SLRs, hip abd/add, and LAQ. All ther-ex performed x 12 reps with cga and safe technique.    General Comments        Pertinent Vitals/Pain Pain Assessment: 0-10 Pain Score: 6  Pain Location: R knee Pain Descriptors / Indicators: Operative site guarding Pain Intervention(s): Limited activity within patient's tolerance;Premedicated before session;Ice applied    Home Living                      Prior Function            PT Goals (current goals can now be found in the care plan section) Acute Rehab PT Goals Patient  Stated Goal: to go home PT Goal Formulation: With patient Time For Goal Achievement: 05/15/17 Potential to Achieve Goals: Good Progress towards PT goals: Progressing toward goals    Frequency    BID      PT Plan Current plan remains appropriate    Co-evaluation              AM-PAC PT "6 Clicks" Daily Activity  Outcome Measure  Difficulty turning over in bed (including adjusting bedclothes, sheets and blankets)?: A Little Difficulty moving from lying on back to sitting on the side of the bed? : Unable Difficulty sitting down on and standing up from a chair with arms (e.g., wheelchair, bedside commode, etc,.)?: Unable Help needed moving to and from a bed to chair  (including a wheelchair)?: A Little Help needed walking in hospital room?: A Little Help needed climbing 3-5 steps with a railing? : A Little 6 Click Score: 14    End of Session Equipment Utilized During Treatment: Gait belt Activity Tolerance: Patient tolerated treatment well Patient left: in chair;with chair alarm set Nurse Communication: Mobility status PT Visit Diagnosis: Muscle weakness (generalized) (M62.81);Difficulty in walking, not elsewhere classified (R26.2);Pain Pain - Right/Left: Right Pain - part of body: Knee     Time: 0814-4818 PT Time Calculation (min) (ACUTE ONLY): 23 min  Charges:  $Gait Training: 8-22 mins $Therapeutic Exercise: 8-22 mins                    G Codes:  Functional Assessment Tool Used: AM-PAC 6 Clicks Basic Mobility Functional Limitation: Mobility: Walking and moving around Mobility: Walking and Moving Around Current Status (H6314): At least 40 percent but less than 60 percent impaired, limited or restricted Mobility: Walking and Moving Around Goal Status [phone]): At least 20 percent but less than 40 percent impaired, limited or restricted    Bryan Santiago, PT, DPT [phone]    Bryan Santiago 05/02/2017, 2:20 PM

## 2017-05-02 NOTE — Progress Notes (Signed)
Physical Therapy Treatment Patient Details Name: Bryan Santiago MRN: [id number] DOB: 11-04-1950 Today's Date: 05/02/2017    History of Present Illness Pt. is a 66 y.o. male who was admitted to [facility] for a Right TKR. Pt. PMHx includes: OA of the Rigiht knee, Chest pain with moderate risk of cardiadc Etiology, Essential HTN, HLD, and B12 Deficiency.    PT Comments    Pt is making good progress towards goals with increased ambulation distance this session. Good endurance with RW, however still needs cues for sequencing. Good endurance with there-ex and pt appears to be motivated. HEP given and reviewed. Will continue to progress. No reports of dizziness this session. Pt left with RN and OT in room.   Follow Up Recommendations  Home health PT     Equipment Recommendations  Rolling walker with 5" wheels;3in1 (PT)    Recommendations for Other Services       Precautions / Restrictions Precautions Precautions: Knee;Fall Precaution Booklet Issued: Yes (comment) Restrictions Weight Bearing Restrictions: Yes RLE Weight Bearing: Weight bearing as tolerated    Mobility  Bed Mobility Overal bed mobility: Needs Assistance Bed Mobility: Supine to Sit     Supine to sit: Min guard     General bed mobility comments: safe technique performed with slight assist for sliding R LE off bed.  Transfers Overall transfer level: Needs assistance Equipment used: Rolling walker (2 wheeled) Transfers: Sit to/from Stand Sit to Stand: Min guard         General transfer comment: No dizziness noted prior to OOB mobility. Cues to push from seated surface. Once standing, able to stand with upright posture.  Ambulation/Gait Ambulation/Gait assistance: Min guard Ambulation Distance (Feet): 115 Feet Assistive device: Rolling walker (2 wheeled) Gait Pattern/deviations: Step-through pattern     General Gait Details: ambulated using RW and reciprocal gait pattern. No dizziness noted. Cues for fluid gait  including keeping upright posture and head up. Also needs verbal cues to relax shoulders.    Stairs            Wheelchair Mobility    Modified Rankin (Stroke Patients Only)       Balance                                            Cognition Arousal/Alertness: Awake/alert Behavior During Therapy: WFL for tasks assessed/performed Overall Cognitive Status: Within Functional Limits for tasks assessed                                        Exercises Total Joint Exercises Goniometric ROM: R knee AAROM: 2-85 degrees Other Exercises Other Exercises: supine ther-ex performed on R LE including ankle pumps, quad sets, SLRs, hip abd/add, SAQ and seated knee flexion stretches. All ther-ex performed x cga and cues for correct technique. HEP reviewed in regards to frequency and duration.    General Comments        Pertinent Vitals/Pain Pain Assessment: 0-10 Pain Score: 6  Pain Location: R knee Pain Descriptors / Indicators: Operative site guarding Pain Intervention(s): Limited activity within patient's tolerance;Ice applied    Home Living Family/patient expects to be discharged to:: Private residence Living Arrangements: Spouse/significant other Available Help at Discharge: Family Type of Home: House Home Access: Stairs to enter Entrance Stairs-Rails: Can reach both  Home Layout: Two level;Bed/bath upstairs Home Equipment: Shower seat - built in;Grab bars - tub/shower      Prior Function Level of Independence: Independent          PT Goals (current goals can now be found in the care plan section) Acute Rehab PT Goals Patient Stated Goal: to go home PT Goal Formulation: With patient Time For Goal Achievement: 05/15/17 Potential to Achieve Goals: Good Progress towards PT goals: Progressing toward goals    Frequency    BID      PT Plan Current plan remains appropriate    Co-evaluation              AM-PAC PT "6  Clicks" Daily Activity  Outcome Measure  Difficulty turning over in bed (including adjusting bedclothes, sheets and blankets)?: A Little Difficulty moving from lying on back to sitting on the side of the bed? : Unable Difficulty sitting down on and standing up from a chair with arms (e.g., wheelchair, bedside commode, etc,.)?: Unable Help needed moving to and from a bed to chair (including a wheelchair)?: A Little Help needed walking in hospital room?: A Little Help needed climbing 3-5 steps with a railing? : A Little 6 Click Score: 14    End of Session Equipment Utilized During Treatment: Gait belt Activity Tolerance: Patient tolerated treatment well Patient left: in chair;with chair alarm set Nurse Communication: Mobility status PT Visit Diagnosis: Muscle weakness (generalized) (M62.81);Difficulty in walking, not elsewhere classified (R26.2);Pain Pain - Right/Left: Right Pain - part of body: Knee     Time: 7829-5621 PT Time Calculation (min) (ACUTE ONLY): 23 min  Charges:  $Gait Training: 8-22 mins $Therapeutic Exercise: 8-22 mins                    G Codes:  Functional Assessment Tool Used: AM-PAC 6 Clicks Basic Mobility Functional Limitation: Mobility: Walking and moving around Mobility: Walking and Moving Around Current Status (H0865): At least 40 percent but less than 60 percent impaired, limited or restricted Mobility: Walking and Moving Around Goal Status [phone]): At least 20 percent but less than 40 percent impaired, limited or restricted    Greggory Stallion, PT, DPT [phone]    Bryan Santiago 05/02/2017, 10:23 AM

## 2017-05-02 NOTE — Anesthesia Postprocedure Evaluation (Signed)
Anesthesia Post Note  Patient: KARANVEER RAMAKRISHNAN  Procedure(s) Performed: COMPUTER ASSISTED TOTAL KNEE ARTHROPLASTY (Right Knee)  Patient location during evaluation: Nursing Unit Anesthesia Type: Spinal Level of consciousness: awake, awake and alert and oriented Pain management: pain level controlled Vital Signs Assessment: post-procedure vital signs reviewed and stable Respiratory status: spontaneous breathing Cardiovascular status: blood pressure returned to baseline Postop Assessment: no headache, no backache, no apparent nausea or vomiting and adequate PO intake Anesthetic complications: no     Last Vitals:  Vitals:   05/02/17 0436 05/02/17 0711  BP: (!) 119/56 (!) 145/72  Pulse: 72 (!) 20  Resp: 16   Temp: 37.4 C 36.7 C  SpO2: 96% 95%    Last Pain:  Vitals:   05/02/17 0711  TempSrc: Axillary  PainSc:                  Hedda Slade

## 2017-05-02 NOTE — Discharge Summary (Signed)
Physician Discharge Summary  Patient ID: IZEK CORVINO MRN: [id number] DOB/AGE: 08/03/1950 66 y.o.  Admit date: 05/01/2017 Discharge date: 05/03/2017  Admission Diagnoses:  PRIMARY OSTEOARTHRITIS OF RIGHT KNEE   Discharge Diagnoses: Patient Active Problem List   Diagnosis Date Noted  . S/P total knee arthroplasty 05/01/2017  . Primary osteoarthritis of right knee 12/23/2016  . Chest pain with moderate risk for cardiac etiology 11/19/2016  . Essential hypertension 11/19/2016  . HLD (hyperlipidemia) 11/19/2016  . B12 deficiency 10/15/2016    Past Medical History:  Diagnosis Date  . Allergic rhinitis   . Arthritis   . Cancer ([place])    Basal Cell Skin Cancer  . GERD (gastroesophageal reflux disease)   . History of diverticulosis   . Hyperlipemia   . Hypertension   . Vitamin B 12 deficiency      Transfusion: No transfusions on this admission   Consultants (if any):   Discharged Condition: Improved  Hospital Course: TRUE GARCIAMARTINEZ is an 66 y.o. male who was admitted 05/01/2017 with a diagnosis of degenerative arthrosis right knee and went to the operating room on 05/01/2017 and underwent the above named procedures.    Surgeries:Procedure(s): COMPUTER ASSISTED TOTAL KNEE ARTHROPLASTY on 05/01/2017  PRE-OPERATIVE DIAGNOSIS: Degenerative arthrosis of the right knee, primary  POST-OPERATIVE DIAGNOSIS:  Same  PROCEDURE:  Right total knee arthroplasty using computer-assisted navigation  SURGEON:  Marciano Sequin. M.D.  ASSISTANT:  Vance Peper, PA (present and scrubbed throughout the case, critical for assistance with exposure, retraction, instrumentation, and closure)  ANESTHESIA: spinal  ESTIMATED BLOOD LOSS: 50 mL  FLUIDS REPLACED: 1350 mL of crystalloid  TOURNIQUET TIME: 86 minutes  DRAINS: 2 medium Hemovac drains  SOFT TISSUE RELEASES: Anterior cruciate ligament, posterior cruciate ligament, deep medial collateral ligament, patellofemoral  ligament  IMPLANTS UTILIZED: DePuy Attune size 7 posterior stabilized femoral component (cemented), size 8 rotating platform tibial component (cemented), 41 mm medialized dome patella (cemented), and a 7 mm stabilized rotating platform polyethylene insert.  INDICATIONS FOR SURGERY: UNIQUE SEARFOSS is a 66 y.o. year old male with a long history of progressive knee pain. X-rays demonstrated severe degenerative changes in tricompartmental fashion. The patient had not seen any significant improvement despite conservative nonsurgical intervention. After discussion of the risks and benefits of surgical intervention, the patient expressed understanding of the risks benefits and agree with plans for total knee arthroplasty.   The risks, benefits, and alternatives were discussed at length including but not limited to the risks of infection, bleeding, nerve injury, stiffness, blood clots, the need for revision surgery, cardiopulmonary complications, among others, and they were willing to proceed.    Patient tolerated the surgery well. No complications .Patient was taken to PACU where she was stabilized and then transferred to the orthopedic floor.  Patient started on Lovenox 30 mg q 12 hrs. Foot pumps applied bilaterally at 80 mm hgb. Heels elevated off bed with rolled towels. No evidence of DVT. Calves non tender. Negative Homan. Physical therapy started on day #1 for gait training and transfer with OT starting on  day #1 for ADL and assisted devices. Patient has done well with therapy. Ambulated greater than 200 feet upon being discharged. Was able to ascend and descend 4 steps safely and independently  Patient's IV And Foley were discontinued on day #1 with Hemovac being discontinued on day #2. Dressing was changed on day 2 prior to patient being discharged   He was given perioperative antibiotics:  Anti-infectives (From admission, onward)  Start     Dose/Rate Route Frequency Ordered Stop   05/01/17  1230  clindamycin (CLEOCIN) IVPB 600 mg     600 mg 100 mL/hr over 30 Minutes Intravenous Every 6 hours 05/01/17 1219 05/02/17 1459   05/01/17 0605  clindamycin (CLEOCIN) 900 MG/50ML IVPB    Comments:  Sylvester Harder   : cabinet override      05/01/17 0605 05/01/17 0744   05/01/17 0600  clindamycin (CLEOCIN) IVPB 900 mg     900 mg 100 mL/hr over 30 Minutes Intravenous On call to O.R. 04/30/17 2306 05/01/17 0754    .  He was fitted with AV 1 compression foot pump devices, instructed on heel pumps, early ambulation, and fitted with TED stockings bilaterally for DVT prophylaxis.  He benefited maximally from the hospital stay and there were no complications.    Recent vital signs:  Vitals:   05/02/17 0436 05/02/17 0711  BP: (!) 119/56 (!) 145/72  Pulse: 72 (!) 20  Resp: 16   Temp: 99.3 F (37.4 C) 98 F (36.7 C)  SpO2: 96% 95%    Recent laboratory studies:  Lab Results  Component Value Date   HGB 13.7 04/17/2017   HGB 12.8 (L) 11/19/2016   HGB 14.0 11/18/2016   Lab Results  Component Value Date   WBC 4.3 04/17/2017   PLT 360 04/17/2017   Lab Results  Component Value Date   INR 0.89 04/17/2017   Lab Results  Component Value Date   NA 137 04/17/2017   K 4.0 04/17/2017   CL 104 04/17/2017   CO2 25 04/17/2017   BUN 12 04/17/2017   CREATININE 0.86 04/17/2017   GLUCOSE 87 04/17/2017    Discharge Medications:   Allergies as of 05/02/2017      Reactions   Ace Inhibitors Swelling   angioedema   Mucinex [guaifenesin Er]    Facial swelling    Penicillins Swelling   Has patient had a PCN reaction causing immediate rash, facial/tongue/throat swelling, SOB or lightheadedness with hypotension: Yes Has patient had a PCN reaction causing severe rash involving mucus membranes or skin necrosis: No Has patient had a PCN reaction that required hospitalization: No Has patient had a PCN reaction occurring within the last 10 years: Yes If all of the above answers are "NO", then  may proceed with Cephalosporin use. Happened 06/2016. Facial swelling    Quinolones    Pt states he doesn't have any recollection of this being an allergy       Medication List    STOP taking these medications   etodolac 400 MG tablet Commonly known as:  LODINE   omeprazole 40 MG capsule Commonly known as:  PRILOSEC     TAKE these medications   amLODipine 5 MG tablet Commonly known as:  NORVASC Take 5 mg 2 (two) times daily by mouth.   enoxaparin 40 MG/0.4ML injection Commonly known as:  LOVENOX Inject 0.4 mLs (40 mg total) into the skin daily for 14 days.   oxyCODONE 5 MG immediate release tablet Commonly known as:  Oxy IR/ROXICODONE Take 1 tablet (5 mg total) by mouth every 4 (four) hours as needed for moderate pain ((score 4 to 6)).   simvastatin 40 MG tablet Commonly known as:  ZOCOR Take 40 mg every morning by mouth.   telmisartan 80 MG tablet Commonly known as:  MICARDIS Take 80 mg daily by mouth.   traMADol 50 MG tablet Commonly known as:  ULTRAM Take 1-2 tablets (50-100 mg  total) by mouth every 4 (four) hours as needed for moderate pain.            Durable Medical Equipment  (From admission, onward)        Start     Ordered   05/01/17 1218  DME Walker rolling  Once    Question:  Patient needs a walker to treat with the following condition  Answer:  Total knee replacement status   05/01/17 1219   05/01/17 1218  DME Bedside commode  Once    Question:  Patient needs a bedside commode to treat with the following condition  Answer:  Total knee replacement status   05/01/17 1219      Diagnostic Studies: Dg Knee Right Port  Result Date: 05/01/2017 CLINICAL DATA:  Status post right total knee replacement. EXAM: PORTABLE RIGHT KNEE - 1-2 VIEW COMPARISON:  None. FINDINGS: The femoral and tibial components are well situated. Surgical drains are seen anterior to the distal femoral shaft. Expected postsurgical changes are noted in the soft tissues anteriorly.  IMPRESSION: Status post right total knee arthroplasty. Electronically Signed   By: Marijo Conception, M.D.   On: 05/01/2017 11:27    Disposition: 01-Home or Self Care  Discharge Instructions    Diet - low sodium heart healthy   Complete by:  As directed    Increase activity slowly   Complete by:  As directed       Follow-up Information    Watt Climes, PA On 05/16/2017.   Specialty:  Physician Assistant Why:  at 1:15pm Contact information: [place] [address] [phone]        Dereck Leep, MD On 06/13/2016.   Specialty:  Orthopedic Surgery Why:  at 10:00am Contact information: [place] [address] [phone]            Signed: Watt Climes 05/02/2017, 7:51 AM

## 2017-05-02 NOTE — Progress Notes (Signed)
Pt and spouse educated on how to give Lovenox. Instructed spouse and pt that Lovenox is given for blood thinner. Pt and spouse verbally educated the proper method for administration of Lovenox. All questions answered. Pt and wife verb understanding. Will have pt and wife administer at next dosage.

## 2017-05-02 NOTE — Progress Notes (Signed)
Clinical Social Worker (CSW) received SNF consult. PT is recommending home health. RN case manager aware of above. Please reconsult if future social work needs arise. CSW signing off.   Aigner Horseman, LCSW [phone] 

## 2017-05-02 NOTE — Progress Notes (Signed)
   Subjective: 1 Day Post-Op Procedure(s) (LRB): COMPUTER ASSISTED TOTAL KNEE ARTHROPLASTY (Right) Patient reports pain as mild.   Patient is well, and has had no acute complaints or problems Did well with therapy yesterday. Was able to ambulate 3 feet. Patient however was noted to have issues with hypertension and became dizzy. Norvasc was placed on hold.  Plan is to go Home after hospital stay. no nausea and no vomiting Patient denies any chest pains or shortness of breath. Objective: Vital signs in last 24 hours: Temp:  [97.2 F (36.2 C)-99.3 F (37.4 C)] 98 F (36.7 C) (11/29 0711) Pulse Rate:  [20-75] 20 (11/29 0711) Resp:  [15-20] 16 (11/29 0436) BP: (73-145)/(42-80) 145/72 (11/29 0711) SpO2:  [93 %-97 %] 95 % (11/29 0711) Weight:  [86.6 kg (191 lb)] 86.6 kg (191 lb) (11/28 1800) Heels are non tender and elevated off the bed using rolled towels along with bone foam under operative leg  Intake/Output from previous day: 11/28 0701 - 11/29 0700 In: 3735 [P.O.:660; I.V.:2925; IV Piggyback:150] Out: 2925 [Urine:2725; Drains:150; Blood:50] Intake/Output this shift: No intake/output data recorded.  No results for input(s): HGB in the last 72 hours. No results for input(s): WBC, RBC, HCT, PLT in the last 72 hours. No results for input(s): NA, K, CL, CO2, BUN, CREATININE, GLUCOSE, CALCIUM in the last 72 hours. No results for input(s): LABPT, INR in the last 72 hours.  EXAM General - Patient is Alert, Appropriate and Oriented Extremity - Neurologically intact Neurovascular intact Sensation intact distally Intact pulses distally Dorsiflexion/Plantar flexion intact Compartment soft Dressing - dressing C/D/I Motor Function - intact, moving foot and toes well on exam. Able to do straight leg raise on his own  Past Medical History:  Diagnosis Date  . Allergic rhinitis   . Arthritis   . Cancer ([place])    Basal Cell Skin Cancer  . GERD (gastroesophageal reflux disease)   .  History of diverticulosis   . Hyperlipemia   . Hypertension   . Vitamin B 12 deficiency     Assessment/Plan: 1 Day Post-Op Procedure(s) (LRB): COMPUTER ASSISTED TOTAL KNEE ARTHROPLASTY (Right) Active Problems:   S/P total knee arthroplasty  Estimated body mass index is 28.21 kg/m as calculated from the following:   Height as of this encounter: 5\' 9"  (1.753 m).   Weight as of this encounter: 86.6 kg (191 lb). Advance diet Up with therapy D/C IV fluids Plan for discharge tomorrow Discharge home with home health  Labs: Were reviewed and acceptable DVT Prophylaxis - Lovenox, Foot Pumps and TED hose Weight-Bearing as tolerated to right leg D/C O2 and Pulse OX and try on Room Air Begin working on bowel movement Continue to hold blood pressure medicine at this time Labs tomorrow morning  Jon R. [place] 05/02/2017, 7:40 AM

## 2017-05-02 NOTE — Evaluation (Signed)
Occupational Therapy Evaluation Patient Details Name: Bryan Santiago MRN: [id number] DOB: July 08, 1950 Today's Date: 05/02/2017    History of Present Illness Pt. is a 66 y.o. male who was admitted to [facility] for a Right TKR. Pt. PMHx includes: OA of the Rigiht knee, Chest pain with moderate risk of cardiadc Etiology, Essential HTN, HLD, and B12 Deficiency.   Clinical Impression   Pt. presents limited ROM, 7/10 knee pain, and limited functional mobility which hinders his ability to complete ADL, and IADL tasks. Pt. resides at home with his wife. Pt. was previously independent with ADLs, IADLs including: driving, medication management, light meal prep, and working as a Naval architect. Pt. enjoyed playing with his grandkids, and playing golf with his sons. Pt. Would like to return to these activities painfree. Pt. Education was provided about A/E use for LE ADLs. Pt. Could beneift from skilled OT services for ADL training, A/E training, and pt. Education about home modification, and DME. Pt. Plans to return home with his wife, who works from home, and will assist pt. as needed.    Follow Up Recommendations  No OT follow up    Equipment Recommendations       Recommendations for Other Services       Precautions / Restrictions Precautions Precautions: Knee;Fall Restrictions Weight Bearing Restrictions: Yes RLE Weight Bearing: Weight bearing as tolerated             ADL either performed or assessed with clinical judgement   ADL Overall ADL's : Needs assistance/impaired Eating/Feeding: Set up   Grooming: Set up   Upper Body Bathing: Independent   Lower Body Bathing: Minimal assistance   Upper Body Dressing : Independent   Lower Body Dressing: Minimal assistance               Functional mobility during ADLs: Min guard General ADL Comments: Pt. education was provided about A/E use for LE ADLs, positioning.     Vision Baseline Vision/History: Wears glasses Wears  Glasses: Reading only Patient Visual Report: No change from baseline       Perception     Praxis      Pertinent Vitals/Pain Pain Assessment: 0-10 Pain Score: 7  Pain Descriptors / Indicators: Operative site guarding Pain Intervention(s): Premedicated before session;Ice applied     Hand Dominance Right   Extremity/Trunk Assessment Upper Extremity Assessment Upper Extremity Assessment: Overall WFL for tasks assessed           Communication Communication Communication: No difficulties   Cognition Arousal/Alertness: Awake/alert Behavior During Therapy: WFL for tasks assessed/performed Overall Cognitive Status: Within Functional Limits for tasks assessed                                     General Comments       Exercises     Shoulder Instructions      Home Living Family/patient expects to be discharged to:: Private residence Living Arrangements: Spouse/significant other Available Help at Discharge: Family Type of Home: House Home Access: Stairs to enter Technical brewer of Steps: 3 Entrance Stairs-Rails: Can reach both Home Layout: Two level;Bed/bath upstairs Alternate Level Stairs-Number of Steps: whole flight             Home Equipment: Shower seat - built in;Grab bars - tub/shower          Prior Functioning/Environment Level of Independence: Independent  OT Problem List: Decreased knowledge of use of DME or AE;Decreased range of motion;Pain      OT Treatment/Interventions: Self-care/ADL training;Therapeutic activities;Energy conservation;Patient/family education;DME and/or AE instruction    OT Goals(Current goals can be found in the care plan section) Acute Rehab OT Goals Patient Stated Goal: To be able to play with his grandkids, and play golf without pain OT Goal Formulation: With patient Potential to Achieve Goals: Good  OT Frequency: Min 1X/week   Barriers to D/C:            Co-evaluation               AM-PAC PT "6 Clicks" Daily Activity     Outcome Measure Help from another person eating meals?: None Help from another person taking care of personal grooming?: A Little Help from another person toileting, which includes using toliet, bedpan, or urinal?: A Little Help from another person bathing (including washing, rinsing, drying)?: A Little Help from another person to put on and taking off regular upper body clothing?: None Help from another person to put on and taking off regular lower body clothing?: A Little 6 Click Score: 20   End of Session Equipment Utilized During Treatment: Gait belt  Activity Tolerance: Patient tolerated treatment well Patient left: in chair;with chair alarm set;with call bell/phone within reach  OT Visit Diagnosis: Muscle weakness (generalized) (M62.81)                Time: 8469-6295 OT Time Calculation (min): 30 min Charges:  OT General Charges $OT Visit: 1 Visit OT Evaluation $OT Eval Moderate Complexity: 1 Mod G-Codes:     Harrel Carina, MS, OTR/L   Harrel Carina, MS, OTR/L 05/02/2017, 9:59 AM

## 2017-05-02 NOTE — Care Management Note (Signed)
Case Management Note  Patient Details  Name: Bryan Santiago MRN: [id number] Date of Birth: 03/20/1951  Subjective/Objective:  POD # ! Right TKA. Met with patient and his wife to discuss discharge planning. It is anticipated that patient will discharge home tomorrow. Offered choice of home health agencies. Referral to [facility] for HHPT. He will need a walker and BSC. Ordered from [facility]. Pharmacy: [facility] [phone]. Will call Lovenox prior to discharge.                    Action/Plan: [facility] for walker and bsc. [facility] for [place].  Expected Discharge Date:  05/03/17               Expected Discharge Plan:  [place]  In-House Referral:     Discharge planning Services  CM Consult  Post Acute Care Choice:  Durable Medical Equipment, Home Health Choice offered to:  Patient, Spouse  DME Arranged:  Bedside commode, Walker rolling DME Agency:  [place]:  PT [place]:  [facility]  Status of Service:  In process, will continue to follow  If discussed at Long Length of Stay Meetings, dates discussed:    Additional Comments:  Jolly Mango, RN 05/02/2017, 2:01 PM

## 2017-05-03 NOTE — Care Management Note (Signed)
Case Management Note  Patient Details  Name: Bryan Santiago MRN: [id number] Date of Birth: [birth year]  Subjective/Objective:   Discharging today                 Action/Plan: [facility] notified of discharge. Lovenox called in by provider. Cost is $85. Patient updated and denies issues paying for medications.   Expected Discharge Date:  05/03/17               Expected Discharge Plan:  [place]  In-House Referral:     Discharge planning Services  CM Consult  Post Acute Care Choice:  Durable Medical Equipment, Home Health Choice offered to:  Patient, Spouse  DME Arranged:  Bedside commode, Walker rolling DME Agency:  [place]:  PT [place]:  [facility] (formerly [facility])  Status of Service:  Completed, signed off  If discussed at [employer], dates discussed:    Additional Comments:  Jolly Mango, RN 05/03/2017, 8:25 AM

## 2017-05-03 NOTE — Discharge Planning (Signed)
Patient IV removed.  RN assessment and VS revealed stability for DC to home.  Dressing changed prior to discharge and pain under control.  Discharge papers printed, explained, and educated.  Informed of suggested FU appt and appt made.  Pain scripts sign and given and lovenox e-scribed to [facility]). Extra hip dressing sent with patient to use if gets wet.  Once ready, will be wheeled to front and family transporting home via car.

## 2017-05-03 NOTE — Progress Notes (Signed)
Physical Therapy Treatment Patient Details Name: Bryan Santiago MRN: [id number] DOB: Sep 13, 1950 Today's Date: 05/03/2017    History of Present Illness Pt. is a 66 y.o. male who was admitted to [facility] for a Right TKR. Pt. PMHx includes: OA of the Rigiht knee, Chest pain with moderate risk of cardiadc Etiology, Essential HTN, HLD, and B12 Deficiency.    PT Comments    Pt is safe to dc this date. Pt has met all goals. Pt has safely performed ambulation with slight antalgic gait pattern this date, secondary to soreness. Good tolerance for ROM, encouraged to start keeping 2 towel rolls under ankle as he is unable to tolerate bone foam. Still needs to work on extension ROM. RW and BSC delivered, used RW for ambulation. Reports he feels comfortable with stair training from previous date. Will sign off at this time.   Follow Up Recommendations  Home health PT     Equipment Recommendations  Rolling walker with 5" wheels;3in1 (PT)    Recommendations for Other Services       Precautions / Restrictions Precautions Precautions: Knee;Fall Precaution Booklet Issued: Yes (comment) Restrictions Weight Bearing Restrictions: Yes RLE Weight Bearing: Weight bearing as tolerated    Mobility  Bed Mobility               General bed mobility comments: not performed as pt received in chair  Transfers Overall transfer level: Needs assistance Equipment used: Rolling walker (2 wheeled) Transfers: Sit to/from Stand Sit to Stand: Supervision         General transfer comment: safe technique performed with RW. Upright posture noted  Ambulation/Gait Ambulation/Gait assistance: Supervision Ambulation Distance (Feet): 225 Feet Assistive device: Rolling walker (2 wheeled) Gait Pattern/deviations: Step-through pattern     General Gait Details: ambulated with slight antalgic gait this session secondary to soreness and increased pain. Pt encouraged to demonstrate upright posture during ambulation.  Good speed noted   Stairs            Wheelchair Mobility    Modified Rankin (Stroke Patients Only)       Balance                                            Cognition Arousal/Alertness: Awake/alert Behavior During Therapy: WFL for tasks assessed/performed Overall Cognitive Status: Within Functional Limits for tasks assessed                                        Exercises Total Joint Exercises Goniometric ROM: R Knee AAROM: 1-90 degrees Other Exercises Other Exercises: seated ther-ex performed on R LE including ankle pumps, quad sets, SLRs, hip abd/add, SAQ, and seated knee flexion stretches. All ther-ex performed x 15 reps with cga and safe technique.    General Comments        Pertinent Vitals/Pain Pain Assessment: 0-10 Pain Score: 7  Pain Location: R knee Pain Descriptors / Indicators: Operative site guarding Pain Intervention(s): Limited activity within patient's tolerance;Ice applied;Premedicated before session    Home Living                      Prior Function            PT Goals (current goals can now be found in the care plan  section) Acute Rehab PT Goals Patient Stated Goal: to go home PT Goal Formulation: With patient Time For Goal Achievement: 05/15/17 Potential to Achieve Goals: Good Progress towards PT goals: Progressing toward goals    Frequency    BID      PT Plan Current plan remains appropriate    Co-evaluation              AM-PAC PT "6 Clicks" Daily Activity  Outcome Measure  Difficulty turning over in bed (including adjusting bedclothes, sheets and blankets)?: A Little Difficulty moving from lying on back to sitting on the side of the bed? : None Difficulty sitting down on and standing up from a chair with arms (e.g., wheelchair, bedside commode, etc,.)?: None Help needed moving to and from a bed to chair (including a wheelchair)?: None Help needed walking in hospital  room?: None Help needed climbing 3-5 steps with a railing? : A Little 6 Click Score: 22    End of Session Equipment Utilized During Treatment: Gait belt Activity Tolerance: Patient tolerated treatment well Patient left: in chair;with chair alarm set Nurse Communication: Mobility status PT Visit Diagnosis: Muscle weakness (generalized) (M62.81);Difficulty in walking, not elsewhere classified (R26.2);Pain Pain - Right/Left: Right Pain - part of body: Knee     Time: 9977-4142 PT Time Calculation (min) (ACUTE ONLY): 25 min  Charges:  $Gait Training: 8-22 mins $Therapeutic Exercise: 8-22 mins                    G Codes:  Functional Assessment Tool Used: AM-PAC 6 Clicks Basic Mobility Functional Limitation: Mobility: Walking and moving around Mobility: Walking and Moving Around Current Status (L9532): At least 20 percent but less than 40 percent impaired, limited or restricted Mobility: Walking and Moving Around Goal Status [id number]): At least 1 percent but less than 20 percent impaired, limited or restricted    Greggory Stallion, PT, DPT [phone]    Araina Butrick 05/03/2017, 10:37 AM

## 2017-05-03 NOTE — Progress Notes (Signed)
   Subjective: 2 Days Post-Op Procedure(s) (LRB): COMPUTER ASSISTED TOTAL KNEE ARTHROPLASTY (Right) Patient reports pain as mild.   Patient is well, and has had no acute complaints or problems Patient did extremely well yesterday. Was able to do the lap around the nurse's this as well as steps. Has good range of motion. Plan is to go Home after hospital stay. no nausea and no vomiting Patient denies any chest pains or shortness of breath. Objective: Vital signs in last 24 hours: Temp:  [98 F (36.7 C)-98.5 F (36.9 C)] 98.1 F (36.7 C) (11/29 1945) Pulse Rate:  [20-79] 68 (11/29 1945) Resp:  [18] 18 (11/29 1945) BP: (128-153)/(53-78) 132/69 (11/29 1945) SpO2:  [92 %-99 %] 92 % (11/29 1945) well approximated incision Heels are non tender and elevated off the bed using rolled towels Intake/Output from previous day: 11/29 0701 - 11/30 0700 In: 240 [P.O.:240] Out: 650 [Urine:400; Drains:250] Intake/Output this shift: No intake/output data recorded.  No results for input(s): HGB in the last 72 hours. No results for input(s): WBC, RBC, HCT, PLT in the last 72 hours. No results for input(s): NA, K, CL, CO2, BUN, CREATININE, GLUCOSE, CALCIUM in the last 72 hours. No results for input(s): LABPT, INR in the last 72 hours.  EXAM General - Patient is Alert, Appropriate and Oriented Extremity - Neurologically intact Neurovascular intact Sensation intact distally Intact pulses distally Dorsiflexion/Plantar flexion intact No cellulitis present Compartment soft Dressing - dressing C/D/I Motor Function - intact, moving foot and toes well on exam.    Past Medical History:  Diagnosis Date  . Allergic rhinitis   . Arthritis   . Cancer ([place])    Basal Cell Skin Cancer  . GERD (gastroesophageal reflux disease)   . History of diverticulosis   . Hyperlipemia   . Hypertension   . Vitamin B 12 deficiency     Assessment/Plan: 2 Days Post-Op Procedure(s) (LRB): COMPUTER ASSISTED TOTAL  KNEE ARTHROPLASTY (Right) Active Problems:   S/P total knee arthroplasty  Estimated body mass index is 28.21 kg/m as calculated from the following:   Height as of this encounter: 5\' 9"  (1.753 m).   Weight as of this encounter: 86.6 kg (191 lb). Up with therapy Discharge home with home health  Labs: None DVT Prophylaxis - Lovenox, Foot Pumps and TED hose Weight-Bearing as tolerated to right leg Hemovac was discontinued on today's visit. The tips of the Hemovac were visually examined and intact. Please change dressing to operative leg as well as wash the leg prior to discharge TED stockings are to be on both legs at discharge. Please give the patient 2 extra honeycomb dressings to take home.  Jillyn Ledger. [facility] 05/03/2017, 6:53 AM

## 2017-05-04 DIAGNOSIS — E785 Hyperlipidemia, unspecified: Secondary | ICD-10-CM | POA: Diagnosis not present

## 2017-05-04 DIAGNOSIS — Z9181 History of falling: Secondary | ICD-10-CM | POA: Diagnosis not present

## 2017-05-04 DIAGNOSIS — I10 Essential (primary) hypertension: Secondary | ICD-10-CM | POA: Diagnosis not present

## 2017-05-04 DIAGNOSIS — K219 Gastro-esophageal reflux disease without esophagitis: Secondary | ICD-10-CM | POA: Diagnosis not present

## 2017-05-04 DIAGNOSIS — E538 Deficiency of other specified B group vitamins: Secondary | ICD-10-CM | POA: Diagnosis not present

## 2017-05-04 DIAGNOSIS — K5790 Diverticulosis of intestine, part unspecified, without perforation or abscess without bleeding: Secondary | ICD-10-CM | POA: Diagnosis not present

## 2017-05-04 DIAGNOSIS — Z96651 Presence of right artificial knee joint: Secondary | ICD-10-CM | POA: Diagnosis not present

## 2017-05-04 DIAGNOSIS — Z471 Aftercare following joint replacement surgery: Secondary | ICD-10-CM | POA: Diagnosis not present

## 2017-05-04 DIAGNOSIS — Z85828 Personal history of other malignant neoplasm of skin: Secondary | ICD-10-CM | POA: Diagnosis not present

## 2017-05-14 DIAGNOSIS — I10 Essential (primary) hypertension: Secondary | ICD-10-CM | POA: Diagnosis not present

## 2017-05-14 DIAGNOSIS — Z9181 History of falling: Secondary | ICD-10-CM | POA: Diagnosis not present

## 2017-05-14 DIAGNOSIS — K5790 Diverticulosis of intestine, part unspecified, without perforation or abscess without bleeding: Secondary | ICD-10-CM | POA: Diagnosis not present

## 2017-05-14 DIAGNOSIS — Z96651 Presence of right artificial knee joint: Secondary | ICD-10-CM | POA: Diagnosis not present

## 2017-05-14 DIAGNOSIS — Z85828 Personal history of other malignant neoplasm of skin: Secondary | ICD-10-CM | POA: Diagnosis not present

## 2017-05-14 DIAGNOSIS — E538 Deficiency of other specified B group vitamins: Secondary | ICD-10-CM | POA: Diagnosis not present

## 2017-05-14 DIAGNOSIS — K219 Gastro-esophageal reflux disease without esophagitis: Secondary | ICD-10-CM | POA: Diagnosis not present

## 2017-05-14 DIAGNOSIS — Z471 Aftercare following joint replacement surgery: Secondary | ICD-10-CM | POA: Diagnosis not present

## 2017-05-14 DIAGNOSIS — E785 Hyperlipidemia, unspecified: Secondary | ICD-10-CM | POA: Diagnosis not present

## 2017-05-17 DIAGNOSIS — R262 Difficulty in walking, not elsewhere classified: Secondary | ICD-10-CM | POA: Diagnosis not present

## 2017-05-17 DIAGNOSIS — M25661 Stiffness of right knee, not elsewhere classified: Secondary | ICD-10-CM | POA: Diagnosis not present

## 2017-05-17 DIAGNOSIS — M6281 Muscle weakness (generalized): Secondary | ICD-10-CM | POA: Diagnosis not present

## 2017-05-17 DIAGNOSIS — M25561 Pain in right knee: Secondary | ICD-10-CM | POA: Diagnosis not present

## 2017-05-21 DIAGNOSIS — M25661 Stiffness of right knee, not elsewhere classified: Secondary | ICD-10-CM | POA: Diagnosis not present

## 2017-05-21 DIAGNOSIS — R262 Difficulty in walking, not elsewhere classified: Secondary | ICD-10-CM | POA: Diagnosis not present

## 2017-05-21 DIAGNOSIS — M6281 Muscle weakness (generalized): Secondary | ICD-10-CM | POA: Diagnosis not present

## 2017-05-21 DIAGNOSIS — M25561 Pain in right knee: Secondary | ICD-10-CM | POA: Diagnosis not present

## 2017-05-23 DIAGNOSIS — M25561 Pain in right knee: Secondary | ICD-10-CM | POA: Diagnosis not present

## 2017-05-23 DIAGNOSIS — M6281 Muscle weakness (generalized): Secondary | ICD-10-CM | POA: Diagnosis not present

## 2017-05-23 DIAGNOSIS — M25661 Stiffness of right knee, not elsewhere classified: Secondary | ICD-10-CM | POA: Diagnosis not present

## 2017-05-23 DIAGNOSIS — R262 Difficulty in walking, not elsewhere classified: Secondary | ICD-10-CM | POA: Diagnosis not present

## 2017-05-24 DIAGNOSIS — M6281 Muscle weakness (generalized): Secondary | ICD-10-CM | POA: Diagnosis not present

## 2017-05-24 DIAGNOSIS — M25561 Pain in right knee: Secondary | ICD-10-CM | POA: Diagnosis not present

## 2017-05-24 DIAGNOSIS — M25661 Stiffness of right knee, not elsewhere classified: Secondary | ICD-10-CM | POA: Diagnosis not present

## 2017-05-24 DIAGNOSIS — R262 Difficulty in walking, not elsewhere classified: Secondary | ICD-10-CM | POA: Diagnosis not present

## 2017-05-30 DIAGNOSIS — M6281 Muscle weakness (generalized): Secondary | ICD-10-CM | POA: Diagnosis not present

## 2017-05-30 DIAGNOSIS — M25561 Pain in right knee: Secondary | ICD-10-CM | POA: Diagnosis not present

## 2017-05-30 DIAGNOSIS — M25661 Stiffness of right knee, not elsewhere classified: Secondary | ICD-10-CM | POA: Diagnosis not present

## 2017-05-30 DIAGNOSIS — R262 Difficulty in walking, not elsewhere classified: Secondary | ICD-10-CM | POA: Diagnosis not present

## 2017-05-31 DIAGNOSIS — M25661 Stiffness of right knee, not elsewhere classified: Secondary | ICD-10-CM | POA: Diagnosis not present

## 2017-05-31 DIAGNOSIS — R262 Difficulty in walking, not elsewhere classified: Secondary | ICD-10-CM | POA: Diagnosis not present

## 2017-05-31 DIAGNOSIS — M6281 Muscle weakness (generalized): Secondary | ICD-10-CM | POA: Diagnosis not present

## 2017-05-31 DIAGNOSIS — M25561 Pain in right knee: Secondary | ICD-10-CM | POA: Diagnosis not present

## 2017-06-03 DIAGNOSIS — M25661 Stiffness of right knee, not elsewhere classified: Secondary | ICD-10-CM | POA: Diagnosis not present

## 2017-06-03 DIAGNOSIS — M6281 Muscle weakness (generalized): Secondary | ICD-10-CM | POA: Diagnosis not present

## 2017-06-03 DIAGNOSIS — R262 Difficulty in walking, not elsewhere classified: Secondary | ICD-10-CM | POA: Diagnosis not present

## 2017-06-03 DIAGNOSIS — M25561 Pain in right knee: Secondary | ICD-10-CM | POA: Diagnosis not present

## 2017-06-05 DIAGNOSIS — R262 Difficulty in walking, not elsewhere classified: Secondary | ICD-10-CM | POA: Diagnosis not present

## 2017-06-05 DIAGNOSIS — M6281 Muscle weakness (generalized): Secondary | ICD-10-CM | POA: Diagnosis not present

## 2017-06-05 DIAGNOSIS — M25661 Stiffness of right knee, not elsewhere classified: Secondary | ICD-10-CM | POA: Diagnosis not present

## 2017-06-05 DIAGNOSIS — M25561 Pain in right knee: Secondary | ICD-10-CM | POA: Diagnosis not present

## 2017-06-07 DIAGNOSIS — D485 Neoplasm of uncertain behavior of skin: Secondary | ICD-10-CM | POA: Diagnosis not present

## 2017-06-07 DIAGNOSIS — C44311 Basal cell carcinoma of skin of nose: Secondary | ICD-10-CM | POA: Diagnosis not present

## 2017-06-07 DIAGNOSIS — M6281 Muscle weakness (generalized): Secondary | ICD-10-CM | POA: Diagnosis not present

## 2017-06-07 DIAGNOSIS — M25561 Pain in right knee: Secondary | ICD-10-CM | POA: Diagnosis not present

## 2017-06-07 DIAGNOSIS — Z85828 Personal history of other malignant neoplasm of skin: Secondary | ICD-10-CM | POA: Diagnosis not present

## 2017-06-07 DIAGNOSIS — M25661 Stiffness of right knee, not elsewhere classified: Secondary | ICD-10-CM | POA: Diagnosis not present

## 2017-06-07 DIAGNOSIS — R262 Difficulty in walking, not elsewhere classified: Secondary | ICD-10-CM | POA: Diagnosis not present

## 2017-06-07 DIAGNOSIS — L578 Other skin changes due to chronic exposure to nonionizing radiation: Secondary | ICD-10-CM | POA: Diagnosis not present

## 2017-06-11 DIAGNOSIS — R262 Difficulty in walking, not elsewhere classified: Secondary | ICD-10-CM | POA: Diagnosis not present

## 2017-06-11 DIAGNOSIS — M25661 Stiffness of right knee, not elsewhere classified: Secondary | ICD-10-CM | POA: Diagnosis not present

## 2017-06-11 DIAGNOSIS — M25561 Pain in right knee: Secondary | ICD-10-CM | POA: Diagnosis not present

## 2017-06-11 DIAGNOSIS — M6281 Muscle weakness (generalized): Secondary | ICD-10-CM | POA: Diagnosis not present

## 2017-06-12 DIAGNOSIS — M25561 Pain in right knee: Secondary | ICD-10-CM | POA: Diagnosis not present

## 2017-06-12 DIAGNOSIS — R262 Difficulty in walking, not elsewhere classified: Secondary | ICD-10-CM | POA: Diagnosis not present

## 2017-06-12 DIAGNOSIS — M6281 Muscle weakness (generalized): Secondary | ICD-10-CM | POA: Diagnosis not present

## 2017-06-12 DIAGNOSIS — M25661 Stiffness of right knee, not elsewhere classified: Secondary | ICD-10-CM | POA: Diagnosis not present

## 2017-06-14 DIAGNOSIS — M6281 Muscle weakness (generalized): Secondary | ICD-10-CM | POA: Diagnosis not present

## 2017-06-14 DIAGNOSIS — R262 Difficulty in walking, not elsewhere classified: Secondary | ICD-10-CM | POA: Diagnosis not present

## 2017-06-14 DIAGNOSIS — M25561 Pain in right knee: Secondary | ICD-10-CM | POA: Diagnosis not present

## 2017-06-14 DIAGNOSIS — M25661 Stiffness of right knee, not elsewhere classified: Secondary | ICD-10-CM | POA: Diagnosis not present

## 2017-06-17 DIAGNOSIS — M25661 Stiffness of right knee, not elsewhere classified: Secondary | ICD-10-CM | POA: Diagnosis not present

## 2017-06-17 DIAGNOSIS — M25561 Pain in right knee: Secondary | ICD-10-CM | POA: Diagnosis not present

## 2017-06-17 DIAGNOSIS — R262 Difficulty in walking, not elsewhere classified: Secondary | ICD-10-CM | POA: Diagnosis not present

## 2017-06-17 DIAGNOSIS — M6281 Muscle weakness (generalized): Secondary | ICD-10-CM | POA: Diagnosis not present

## 2017-06-19 DIAGNOSIS — M25661 Stiffness of right knee, not elsewhere classified: Secondary | ICD-10-CM | POA: Diagnosis not present

## 2017-06-19 DIAGNOSIS — M25561 Pain in right knee: Secondary | ICD-10-CM | POA: Diagnosis not present

## 2017-06-19 DIAGNOSIS — R262 Difficulty in walking, not elsewhere classified: Secondary | ICD-10-CM | POA: Diagnosis not present

## 2017-06-19 DIAGNOSIS — M6281 Muscle weakness (generalized): Secondary | ICD-10-CM | POA: Diagnosis not present

## 2017-06-20 DIAGNOSIS — Z96651 Presence of right artificial knee joint: Secondary | ICD-10-CM | POA: Diagnosis not present

## 2017-06-21 DIAGNOSIS — M25661 Stiffness of right knee, not elsewhere classified: Secondary | ICD-10-CM | POA: Diagnosis not present

## 2017-06-21 DIAGNOSIS — M25561 Pain in right knee: Secondary | ICD-10-CM | POA: Diagnosis not present

## 2017-06-21 DIAGNOSIS — R262 Difficulty in walking, not elsewhere classified: Secondary | ICD-10-CM | POA: Diagnosis not present

## 2017-06-21 DIAGNOSIS — M6281 Muscle weakness (generalized): Secondary | ICD-10-CM | POA: Diagnosis not present

## 2017-06-24 DIAGNOSIS — R262 Difficulty in walking, not elsewhere classified: Secondary | ICD-10-CM | POA: Diagnosis not present

## 2017-06-24 DIAGNOSIS — M6281 Muscle weakness (generalized): Secondary | ICD-10-CM | POA: Diagnosis not present

## 2017-06-24 DIAGNOSIS — M25661 Stiffness of right knee, not elsewhere classified: Secondary | ICD-10-CM | POA: Diagnosis not present

## 2017-06-24 DIAGNOSIS — M25561 Pain in right knee: Secondary | ICD-10-CM | POA: Diagnosis not present

## 2017-06-28 DIAGNOSIS — M25561 Pain in right knee: Secondary | ICD-10-CM | POA: Diagnosis not present

## 2017-06-28 DIAGNOSIS — M25661 Stiffness of right knee, not elsewhere classified: Secondary | ICD-10-CM | POA: Diagnosis not present

## 2017-06-28 DIAGNOSIS — R262 Difficulty in walking, not elsewhere classified: Secondary | ICD-10-CM | POA: Diagnosis not present

## 2017-06-28 DIAGNOSIS — M6281 Muscle weakness (generalized): Secondary | ICD-10-CM | POA: Diagnosis not present

## 2017-07-01 DIAGNOSIS — M25561 Pain in right knee: Secondary | ICD-10-CM | POA: Diagnosis not present

## 2017-07-01 DIAGNOSIS — M6281 Muscle weakness (generalized): Secondary | ICD-10-CM | POA: Diagnosis not present

## 2017-07-01 DIAGNOSIS — M25661 Stiffness of right knee, not elsewhere classified: Secondary | ICD-10-CM | POA: Diagnosis not present

## 2017-07-01 DIAGNOSIS — R262 Difficulty in walking, not elsewhere classified: Secondary | ICD-10-CM | POA: Diagnosis not present

## 2017-07-03 DIAGNOSIS — M25661 Stiffness of right knee, not elsewhere classified: Secondary | ICD-10-CM | POA: Diagnosis not present

## 2017-07-03 DIAGNOSIS — M6281 Muscle weakness (generalized): Secondary | ICD-10-CM | POA: Diagnosis not present

## 2017-07-03 DIAGNOSIS — R262 Difficulty in walking, not elsewhere classified: Secondary | ICD-10-CM | POA: Diagnosis not present

## 2017-07-03 DIAGNOSIS — M25561 Pain in right knee: Secondary | ICD-10-CM | POA: Diagnosis not present

## 2017-07-09 DIAGNOSIS — L578 Other skin changes due to chronic exposure to nonionizing radiation: Secondary | ICD-10-CM | POA: Diagnosis not present

## 2017-07-09 DIAGNOSIS — Z85828 Personal history of other malignant neoplasm of skin: Secondary | ICD-10-CM | POA: Diagnosis not present

## 2017-07-12 DIAGNOSIS — M25661 Stiffness of right knee, not elsewhere classified: Secondary | ICD-10-CM | POA: Diagnosis not present

## 2017-07-12 DIAGNOSIS — M25561 Pain in right knee: Secondary | ICD-10-CM | POA: Diagnosis not present

## 2017-07-12 DIAGNOSIS — M6281 Muscle weakness (generalized): Secondary | ICD-10-CM | POA: Diagnosis not present

## 2017-07-12 DIAGNOSIS — R262 Difficulty in walking, not elsewhere classified: Secondary | ICD-10-CM | POA: Diagnosis not present

## 2017-07-17 DIAGNOSIS — M6281 Muscle weakness (generalized): Secondary | ICD-10-CM | POA: Diagnosis not present

## 2017-07-17 DIAGNOSIS — R262 Difficulty in walking, not elsewhere classified: Secondary | ICD-10-CM | POA: Diagnosis not present

## 2017-07-17 DIAGNOSIS — M25561 Pain in right knee: Secondary | ICD-10-CM | POA: Diagnosis not present

## 2017-07-17 DIAGNOSIS — M25661 Stiffness of right knee, not elsewhere classified: Secondary | ICD-10-CM | POA: Diagnosis not present

## 2017-08-13 DIAGNOSIS — Z471 Aftercare following joint replacement surgery: Secondary | ICD-10-CM | POA: Diagnosis not present

## 2017-10-14 DIAGNOSIS — Z125 Encounter for screening for malignant neoplasm of prostate: Secondary | ICD-10-CM | POA: Diagnosis not present

## 2017-10-14 DIAGNOSIS — Z79899 Other long term (current) drug therapy: Secondary | ICD-10-CM | POA: Diagnosis not present

## 2017-10-14 DIAGNOSIS — E538 Deficiency of other specified B group vitamins: Secondary | ICD-10-CM | POA: Diagnosis not present

## 2017-10-14 DIAGNOSIS — E782 Mixed hyperlipidemia: Secondary | ICD-10-CM | POA: Diagnosis not present

## 2017-10-17 DIAGNOSIS — Z79899 Other long term (current) drug therapy: Secondary | ICD-10-CM | POA: Diagnosis not present

## 2017-10-17 DIAGNOSIS — Z125 Encounter for screening for malignant neoplasm of prostate: Secondary | ICD-10-CM | POA: Diagnosis not present

## 2017-10-17 DIAGNOSIS — E782 Mixed hyperlipidemia: Secondary | ICD-10-CM | POA: Diagnosis not present

## 2017-10-17 DIAGNOSIS — E538 Deficiency of other specified B group vitamins: Secondary | ICD-10-CM | POA: Diagnosis not present

## 2017-10-17 DIAGNOSIS — Z0000 Encounter for general adult medical examination without abnormal findings: Secondary | ICD-10-CM | POA: Diagnosis not present

## 2018-03-20 DIAGNOSIS — I10 Essential (primary) hypertension: Secondary | ICD-10-CM | POA: Diagnosis not present

## 2018-05-06 DIAGNOSIS — M1711 Unilateral primary osteoarthritis, right knee: Secondary | ICD-10-CM | POA: Diagnosis not present

## 2018-05-06 DIAGNOSIS — Z96651 Presence of right artificial knee joint: Secondary | ICD-10-CM | POA: Diagnosis not present

## 2018-05-12 DIAGNOSIS — L739 Follicular disorder, unspecified: Secondary | ICD-10-CM | POA: Diagnosis not present

## 2018-05-12 DIAGNOSIS — L570 Actinic keratosis: Secondary | ICD-10-CM | POA: Diagnosis not present

## 2018-05-12 DIAGNOSIS — Z85828 Personal history of other malignant neoplasm of skin: Secondary | ICD-10-CM | POA: Diagnosis not present

## 2018-07-28 ENCOUNTER — Other Ambulatory Visit: Payer: Self-pay | Admitting: Physician Assistant

## 2018-07-28 DIAGNOSIS — M25562 Pain in left knee: Secondary | ICD-10-CM

## 2018-07-28 DIAGNOSIS — M2392 Unspecified internal derangement of left knee: Secondary | ICD-10-CM | POA: Diagnosis not present

## 2018-08-12 ENCOUNTER — Ambulatory Visit
Admission: RE | Admit: 2018-08-12 | Discharge: 2018-08-12 | Disposition: A | Discharge status: Home / Self Care | Payer: PPO | Source: Ambulatory Visit | Attending: Physician Assistant | Admitting: Physician Assistant

## 2018-08-12 DIAGNOSIS — M23322 Other meniscus derangements, posterior horn of medial meniscus, left knee: Secondary | ICD-10-CM | POA: Diagnosis not present

## 2018-08-12 DIAGNOSIS — M23362 Other meniscus derangements, other lateral meniscus, left knee: Secondary | ICD-10-CM | POA: Diagnosis not present

## 2018-08-12 DIAGNOSIS — M2392 Unspecified internal derangement of left knee: Secondary | ICD-10-CM | POA: Diagnosis not present

## 2018-08-12 DIAGNOSIS — M25562 Pain in left knee: Secondary | ICD-10-CM

## 2018-08-15 DIAGNOSIS — M2392 Unspecified internal derangement of left knee: Secondary | ICD-10-CM | POA: Diagnosis not present

## 2018-09-03 ENCOUNTER — Other Ambulatory Visit: Payer: PPO

## 2018-10-13 DIAGNOSIS — E782 Mixed hyperlipidemia: Secondary | ICD-10-CM | POA: Diagnosis not present

## 2018-10-13 DIAGNOSIS — E538 Deficiency of other specified B group vitamins: Secondary | ICD-10-CM | POA: Diagnosis not present

## 2018-10-13 DIAGNOSIS — Z79899 Other long term (current) drug therapy: Secondary | ICD-10-CM | POA: Diagnosis not present

## 2018-10-13 DIAGNOSIS — Z125 Encounter for screening for malignant neoplasm of prostate: Secondary | ICD-10-CM | POA: Diagnosis not present

## 2018-10-20 DIAGNOSIS — E538 Deficiency of other specified B group vitamins: Secondary | ICD-10-CM | POA: Diagnosis not present

## 2018-10-20 DIAGNOSIS — E782 Mixed hyperlipidemia: Secondary | ICD-10-CM | POA: Diagnosis not present

## 2018-10-20 DIAGNOSIS — R0789 Other chest pain: Secondary | ICD-10-CM | POA: Diagnosis not present

## 2018-10-20 DIAGNOSIS — Z125 Encounter for screening for malignant neoplasm of prostate: Secondary | ICD-10-CM | POA: Diagnosis not present

## 2018-10-20 DIAGNOSIS — Z0000 Encounter for general adult medical examination without abnormal findings: Secondary | ICD-10-CM | POA: Diagnosis not present

## 2018-10-24 ENCOUNTER — Encounter
Admission: RE | Admit: 2018-10-24 | Discharge: 2018-10-24 | Disposition: A | Discharge status: Home / Self Care | Payer: PPO | Source: Ambulatory Visit | Attending: Orthopedic Surgery | Admitting: Orthopedic Surgery

## 2018-10-24 ENCOUNTER — Other Ambulatory Visit: Payer: Self-pay

## 2018-10-24 HISTORY — DX: Other complications of anesthesia, initial encounter: T88.59XA

## 2018-10-24 NOTE — Patient Instructions (Signed)
Your procedure is scheduled on: 11-03-18 MONDAY Report to Same Day Surgery 2nd floor medical mall [facility] Entrance-take elevator on left to 2nd floor.  Check in with surgery information desk.) To find out your arrival time please call [phone] between 1PM - 3PM on 10-31-18 FRIDAY  Remember: Instructions that are not followed completely may result in serious medical risk, up to and including death, or upon the discretion of your surgeon and anesthesiologist your surgery may need to be rescheduled.    _x___ 1. Do not eat food after midnight the night before your procedure. NO GUM OR CANDY AFTER MIDNIGHT. You may drink clear liquids up to 2 hours before you are scheduled to arrive at the hospital for your procedure.  Do not drink clear liquids within 2 hours of your scheduled arrival to the hospital.  Clear liquids include  --Water or Apple juice without pulp  --Clear carbohydrate beverage such as ClearFast or Gatorade  --Black Coffee or Clear Tea (No milk, no creamers, do not add anything to the coffee or Tea   ____Ensure clear carbohydrate drink on the way to the hospital for bariatric patients  _X___Ensure clear carbohydrate drink 3 hours before surgery      __x__ 2. No Alcohol for 24 hours before or after surgery.   __x__3. No Smoking or e-cigarettes for 24 prior to surgery.  Do not use any chewable tobacco products for at least 6 hour prior to surgery   ____  4. Bring all medications with you on the day of surgery if instructed.    __x__ 5. Notify your doctor if there is any change in your medical condition     (cold, fever, infections).    x___6. On the morning of surgery brush your teeth with toothpaste and water.  You may rinse your mouth with mouth wash if you wish.  Do not swallow any toothpaste or mouthwash.   Do not wear jewelry, make-up, hairpins, clips or nail polish.  Do not wear lotions, powders, or perfumes. You may wear deodorant.  Do not shave 48 hours prior  to surgery. Men may shave face and neck.  Do not bring valuables to the hospital.    [facility] is not responsible for any belongings or valuables.               Contacts, dentures or bridgework may not be worn into surgery.  Leave your suitcase in the car. After surgery it may be brought to your room.  For patients admitted to the hospital, discharge time is determined by your treatment team.  _  Patients discharged the day of surgery will not be allowed to drive home.  You will need someone to drive you home and stay with you the night of your procedure.    Please read over the following fact sheets that you were given:   [facility] Preparing for Surgery   _x___ TAKE THE FOLLOWING MEDICATION THE MORNING OF SURGERY WITH A SMALL SIP OF WATER. These include:  1. AMLODIPINE (NORVASC)  2. SIMVASTATIN (ZOCOR)  3. PROTONIX (PANTOPRAZOLE)  4. TAKE AN EXTRA PROTONIX THE NIGHT BEFORE YOUR SURGERY  5.  6.  ____Fleets enema or Magnesium Citrate as directed.   _x___ Use CHG Soap or sage wipes as directed on instruction sheet   ____ Use inhalers on the day of surgery and bring to hospital day of surgery  ____ Stop Metformin and Janumet 2 days prior to surgery.    ____ Take 1/2  of usual insulin dose the night before surgery and none on the morning surgery.   ____ Follow recommendations from Cardiologist, Pulmonologist or PCP regarding stopping Aspirin, Coumadin, Plavix ,Eliquis, Effient, or Pradaxa, and Pletal.  X____Stop Anti-inflammatories such as Advil, Aleve, Ibuprofen, Motrin, Naproxen,ETODOLAC, Naprosyn, Goodies powders or aspirin products 7 DAYS PRIOR TO SURGERY - OK to take Tylenol   ____ Stop supplements until after surgery.     ____ Bring C-Pap to the hospital.

## 2018-10-30 ENCOUNTER — Encounter
Admission: RE | Admit: 2018-10-30 | Discharge: 2018-10-30 | Disposition: A | Discharge status: Home / Self Care | Payer: PPO | Source: Ambulatory Visit | Attending: Orthopedic Surgery | Admitting: Orthopedic Surgery

## 2018-10-30 ENCOUNTER — Other Ambulatory Visit: Payer: Self-pay

## 2018-10-30 DIAGNOSIS — Z1159 Encounter for screening for other viral diseases: Secondary | ICD-10-CM | POA: Insufficient documentation

## 2018-10-31 LAB — NOVEL CORONAVIRUS, NAA (HOSP ORDER, SEND-OUT TO REF LAB; TAT 18-24 HRS): SARS-CoV-2, NAA: NOT DETECTED

## 2018-11-03 ENCOUNTER — Other Ambulatory Visit: Payer: Self-pay

## 2018-11-03 ENCOUNTER — Ambulatory Visit: Payer: PPO | Admitting: Certified Registered"

## 2018-11-03 ENCOUNTER — Ambulatory Visit
Admission: RE | Admit: 2018-11-03 | Discharge: 2018-11-03 | Disposition: A | Discharge status: Home / Self Care | Payer: PPO | Source: Ambulatory Visit | Attending: Orthopedic Surgery | Admitting: Orthopedic Surgery

## 2018-11-03 ENCOUNTER — Encounter: Payer: Self-pay | Admitting: Orthopedic Surgery

## 2018-11-03 ENCOUNTER — Ambulatory Visit: Admission: RE | Admit: 2018-11-03 | Payer: PPO | Source: Home / Self Care | Admitting: Orthopedic Surgery

## 2018-11-03 ENCOUNTER — Encounter: Admission: RE | Payer: Self-pay | Source: Home / Self Care

## 2018-11-03 ENCOUNTER — Encounter
Admission: RE | Disposition: A | Discharge status: Home / Self Care | Payer: Self-pay | Source: Ambulatory Visit | Attending: Orthopedic Surgery

## 2018-11-03 DIAGNOSIS — M2242 Chondromalacia patellae, left knee: Secondary | ICD-10-CM | POA: Insufficient documentation

## 2018-11-03 DIAGNOSIS — M1990 Unspecified osteoarthritis, unspecified site: Secondary | ICD-10-CM | POA: Diagnosis not present

## 2018-11-03 DIAGNOSIS — M2392 Unspecified internal derangement of left knee: Secondary | ICD-10-CM | POA: Diagnosis present

## 2018-11-03 DIAGNOSIS — Z9889 Other specified postprocedural states: Secondary | ICD-10-CM

## 2018-11-03 DIAGNOSIS — Z87891 Personal history of nicotine dependence: Secondary | ICD-10-CM | POA: Diagnosis not present

## 2018-11-03 DIAGNOSIS — Z79899 Other long term (current) drug therapy: Secondary | ICD-10-CM | POA: Insufficient documentation

## 2018-11-03 DIAGNOSIS — M25562 Pain in left knee: Secondary | ICD-10-CM | POA: Diagnosis not present

## 2018-11-03 DIAGNOSIS — I10 Essential (primary) hypertension: Secondary | ICD-10-CM | POA: Insufficient documentation

## 2018-11-03 DIAGNOSIS — E785 Hyperlipidemia, unspecified: Secondary | ICD-10-CM | POA: Insufficient documentation

## 2018-11-03 DIAGNOSIS — K219 Gastro-esophageal reflux disease without esophagitis: Secondary | ICD-10-CM | POA: Diagnosis not present

## 2018-11-03 DIAGNOSIS — M94262 Chondromalacia, left knee: Secondary | ICD-10-CM | POA: Diagnosis not present

## 2018-11-03 DIAGNOSIS — E530 Riboflavin deficiency: Secondary | ICD-10-CM | POA: Insufficient documentation

## 2018-11-03 DIAGNOSIS — M23222 Derangement of posterior horn of medial meniscus due to old tear or injury, left knee: Secondary | ICD-10-CM | POA: Diagnosis not present

## 2018-11-03 HISTORY — PX: KNEE ARTHROSCOPY: SHX127

## 2018-11-03 SURGERY — ARTHROSCOPY, KNEE
Anesthesia: Choice | Laterality: Left

## 2018-11-03 SURGERY — ARTHROSCOPY, KNEE
Anesthesia: General | Site: Knee | Laterality: Left

## 2018-11-03 MED ORDER — CELECOXIB 200 MG PO CAPS
400.0000 mg | ORAL_CAPSULE | Freq: Once | ORAL | Status: AC
  Administered 2018-11-03: 14:00:00 400 mg via ORAL

## 2018-11-03 MED ORDER — CLINDAMYCIN PHOSPHATE 900 MG/50ML IV SOLN
900.0000 mg | INTRAVENOUS | Status: AC
  Administered 2018-11-03: 900 mg via INTRAVENOUS

## 2018-11-03 MED ORDER — FENTANYL CITRATE (PF) 100 MCG/2ML IJ SOLN
INTRAMUSCULAR | Status: AC
  Administered 2018-11-03: 18:00:00 50 ug via INTRAVENOUS
  Filled 2018-11-03: qty 2

## 2018-11-03 MED ORDER — OXYCODONE HCL 5 MG PO TABS
ORAL_TABLET | ORAL | Status: AC
  Filled 2018-11-03: qty 1

## 2018-11-03 MED ORDER — ACETAMINOPHEN 10 MG/ML IV SOLN
INTRAVENOUS | Status: DC | PRN
  Administered 2018-11-03: 1000 mg via INTRAVENOUS

## 2018-11-03 MED ORDER — CLINDAMYCIN PHOSPHATE 900 MG/50ML IV SOLN
INTRAVENOUS | Status: AC
  Filled 2018-11-03: qty 50

## 2018-11-03 MED ORDER — GLYCOPYRROLATE 0.2 MG/ML IJ SOLN
INTRAMUSCULAR | Status: AC
  Filled 2018-11-03: qty 1

## 2018-11-03 MED ORDER — MEPERIDINE HCL 50 MG/ML IJ SOLN: 6.2500 mg | INTRAMUSCULAR | Status: DC | PRN

## 2018-11-03 MED ORDER — HYDROCODONE-ACETAMINOPHEN 5-325 MG PO TABS: 1.0000 | ORAL_TABLET | ORAL | 0 refills | Status: AC | PRN

## 2018-11-03 MED ORDER — MORPHINE SULFATE 4 MG/ML IJ SOLN
INTRAMUSCULAR | Status: DC | PRN
  Administered 2018-11-03: 4 mg

## 2018-11-03 MED ORDER — DEXAMETHASONE SODIUM PHOSPHATE 10 MG/ML IJ SOLN
INTRAMUSCULAR | Status: AC
  Filled 2018-11-03: qty 1

## 2018-11-03 MED ORDER — PROMETHAZINE HCL 25 MG/ML IJ SOLN: 6.2500 mg | INTRAMUSCULAR | Status: DC | PRN

## 2018-11-03 MED ORDER — OXYCODONE HCL 5 MG PO TABS
5.0000 mg | ORAL_TABLET | Freq: Once | ORAL | Status: AC | PRN
  Administered 2018-11-03: 5 mg via ORAL

## 2018-11-03 MED ORDER — DEXAMETHASONE SODIUM PHOSPHATE 10 MG/ML IJ SOLN
INTRAMUSCULAR | Status: DC | PRN
  Administered 2018-11-03: 5 mg via INTRAVENOUS

## 2018-11-03 MED ORDER — PHENYLEPHRINE HCL (PRESSORS) 10 MG/ML IV SOLN
INTRAVENOUS | Status: DC | PRN
  Administered 2018-11-03: 100 ug via INTRAVENOUS

## 2018-11-03 MED ORDER — ONDANSETRON HCL 4 MG/2ML IJ SOLN
INTRAMUSCULAR | Status: DC | PRN
  Administered 2018-11-03: 4 mg via INTRAVENOUS

## 2018-11-03 MED ORDER — ACETAMINOPHEN 10 MG/ML IV SOLN
INTRAVENOUS | Status: AC
  Filled 2018-11-03: qty 100

## 2018-11-03 MED ORDER — ONDANSETRON HCL 4 MG/2ML IJ SOLN
INTRAMUSCULAR | Status: AC
  Filled 2018-11-03: qty 2

## 2018-11-03 MED ORDER — DEXMEDETOMIDINE HCL IN NACL 200 MCG/50ML IV SOLN
INTRAVENOUS | Status: DC | PRN
  Administered 2018-11-03: 12 ug via INTRAVENOUS
  Administered 2018-11-03: 8 ug via INTRAVENOUS

## 2018-11-03 MED ORDER — PROPOFOL 10 MG/ML IV BOLUS
INTRAVENOUS | Status: AC
  Filled 2018-11-03: qty 20

## 2018-11-03 MED ORDER — MIDAZOLAM HCL 2 MG/2ML IJ SOLN
INTRAMUSCULAR | Status: AC
  Filled 2018-11-03: qty 2

## 2018-11-03 MED ORDER — PROPOFOL 10 MG/ML IV BOLUS
INTRAVENOUS | Status: DC | PRN
  Administered 2018-11-03: 100 mg via INTRAVENOUS
  Administered 2018-11-03: 200 mg via INTRAVENOUS

## 2018-11-03 MED ORDER — LACTATED RINGERS IV SOLN
INTRAVENOUS | Status: DC
  Administered 2018-11-03: 14:00:00 via INTRAVENOUS

## 2018-11-03 MED ORDER — GLYCOPYRROLATE 0.2 MG/ML IJ SOLN
INTRAMUSCULAR | Status: DC | PRN
  Administered 2018-11-03: 0.2 mg via INTRAVENOUS

## 2018-11-03 MED ORDER — MIDAZOLAM HCL 5 MG/5ML IJ SOLN
INTRAMUSCULAR | Status: DC | PRN
  Administered 2018-11-03: 2 mg via INTRAVENOUS

## 2018-11-03 MED ORDER — LIDOCAINE HCL (PF) 2 % IJ SOLN
INTRAMUSCULAR | Status: DC | PRN
  Administered 2018-11-03: 50 mg

## 2018-11-03 MED ORDER — FENTANYL CITRATE (PF) 100 MCG/2ML IJ SOLN
INTRAMUSCULAR | Status: AC
  Filled 2018-11-03: qty 2

## 2018-11-03 MED ORDER — FENTANYL CITRATE (PF) 100 MCG/2ML IJ SOLN
25.0000 ug | INTRAMUSCULAR | Status: DC | PRN
  Administered 2018-11-03: 18:00:00 25 ug via INTRAVENOUS
  Administered 2018-11-03 (×2): 50 ug via INTRAVENOUS
  Administered 2018-11-03: 25 ug via INTRAVENOUS

## 2018-11-03 MED ORDER — KETAMINE HCL 50 MG/ML IJ SOLN
INTRAMUSCULAR | Status: DC | PRN
  Administered 2018-11-03 (×2): 25 mg via INTRAMUSCULAR

## 2018-11-03 MED ORDER — MORPHINE SULFATE (PF) 4 MG/ML IV SOLN
INTRAVENOUS | Status: AC
  Filled 2018-11-03: qty 1

## 2018-11-03 MED ORDER — BUPIVACAINE-EPINEPHRINE 0.25% -1:200000 IJ SOLN
INTRAMUSCULAR | Status: DC | PRN
  Administered 2018-11-03: 25 mL
  Administered 2018-11-03: 5 mL

## 2018-11-03 MED ORDER — LIDOCAINE HCL (PF) 2 % IJ SOLN
INTRAMUSCULAR | Status: AC
  Filled 2018-11-03: qty 10

## 2018-11-03 MED ORDER — OXYCODONE HCL 5 MG/5ML PO SOLN: 5.0000 mg | Freq: Once | ORAL | Status: AC | PRN

## 2018-11-03 MED ORDER — CHLORHEXIDINE GLUCONATE 4 % EX LIQD: 60.0000 mL | Freq: Once | CUTANEOUS | Status: DC

## 2018-11-03 MED ORDER — FENTANYL CITRATE (PF) 100 MCG/2ML IJ SOLN
INTRAMUSCULAR | Status: AC
  Administered 2018-11-03: 25 ug via INTRAVENOUS
  Filled 2018-11-03: qty 2

## 2018-11-03 MED ORDER — BUPIVACAINE-EPINEPHRINE (PF) 0.25% -1:200000 IJ SOLN
INTRAMUSCULAR | Status: AC
  Filled 2018-11-03: qty 30

## 2018-11-03 MED ORDER — CELECOXIB 200 MG PO CAPS
ORAL_CAPSULE | ORAL | Status: AC
  Administered 2018-11-03: 14:00:00 400 mg via ORAL
  Filled 2018-11-03: qty 2

## 2018-11-03 MED ORDER — FENTANYL CITRATE (PF) 100 MCG/2ML IJ SOLN
INTRAMUSCULAR | Status: DC | PRN
  Administered 2018-11-03 (×3): 50 ug via INTRAVENOUS
  Administered 2018-11-03 (×2): 25 ug via INTRAVENOUS

## 2018-11-03 SURGICAL SUPPLY — 26 items
BLADE SHAVER 4.5 DBL SERAT CV (CUTTER) ×3 IMPLANT
COVER WAND RF STERILE (DRAPES) ×3 IMPLANT
CUFF TOURN SGL QUICK 24 (TOURNIQUET CUFF) ×2
CUFF TOURN SGL QUICK 30 (TOURNIQUET CUFF)
CUFF TRNQT CYL 24X4X16.5-23 (TOURNIQUET CUFF) ×1 IMPLANT
CUFF TRNQT CYL 30X4X21-28X (TOURNIQUET CUFF) IMPLANT
DRAPE U-SHAPE 47X51 STRL (DRAPES) ×3 IMPLANT
DRSG DERMACEA 8X12 NADH (GAUZE/BANDAGES/DRESSINGS) ×3 IMPLANT
DURAPREP 26ML APPLICATOR (WOUND CARE) ×6 IMPLANT
GAUZE SPONGE 4X4 12PLY STRL (GAUZE/BANDAGES/DRESSINGS) ×3 IMPLANT
GLOVE BIOGEL M STRL SZ7.5 (GLOVE) ×6 IMPLANT
GLOVE INDICATOR 8.0 STRL GRN (GLOVE) ×6 IMPLANT
GOWN STRL REUS W/ TWL LRG LVL3 (GOWN DISPOSABLE) ×2 IMPLANT
GOWN STRL REUS W/TWL LRG LVL3 (GOWN DISPOSABLE) ×4
IV LACTATED RINGER IRRG 3000ML (IV SOLUTION) ×12
IV LR IRRIG 3000ML ARTHROMATIC (IV SOLUTION) ×6 IMPLANT
KIT TURNOVER KIT A (KITS) ×3 IMPLANT
MANIFOLD NEPTUNE II (INSTRUMENTS) ×3 IMPLANT
PACK ARTHROSCOPY KNEE (MISCELLANEOUS) ×3 IMPLANT
SET TUBE SUCT SHAVER OUTFL 24K (TUBING) ×3 IMPLANT
SET TUBE TIP INTRA-ARTICULAR (MISCELLANEOUS) ×3 IMPLANT
SUT ETHILON 3-0 FS-10 30 BLK (SUTURE) ×3
SUTURE EHLN 3-0 FS-10 30 BLK (SUTURE) ×1 IMPLANT
TUBING ARTHRO INFLOW-ONLY STRL (TUBING) ×3 IMPLANT
WAND HAND CNTRL MULTIVAC 50 (MISCELLANEOUS) ×3 IMPLANT
WRAP KNEE W/COLD PACKS 25.5X14 (SOFTGOODS) ×3 IMPLANT

## 2018-11-03 NOTE — Anesthesia Post-op Follow-up Note (Signed)
Anesthesia QCDR form completed.        

## 2018-11-03 NOTE — Anesthesia Procedure Notes (Signed)
Procedure Name: LMA Insertion Performed by: Rolla Plate, CRNA Pre-anesthesia Checklist: Patient identified, Patient being monitored, Timeout performed, Emergency Drugs available and Suction available Patient Re-evaluated:Patient Re-evaluated prior to induction Oxygen Delivery Method: Circle system utilized Preoxygenation: Pre-oxygenation with 100% oxygen Induction Type: IV induction Ventilation: Mask ventilation without difficulty LMA: LMA inserted LMA Size: 4.0 Tube type: Oral Number of attempts: 2 Placement Confirmation: positive ETCO2 and breath sounds checked- equal and bilateral Tube secured with: Tape Dental Injury: Teeth and Oropharynx as per pre-operative assessment

## 2018-11-03 NOTE — Transfer of Care (Signed)
Immediate Anesthesia Transfer of Care Note  Patient: Bryan Santiago  Procedure(s) Performed: ARTHROSCOPY KNEE LEFT MEDIAL MENISCUS TEAR POSTERIOR HORN, PATELLA FEMORAL CHONDROMALACIA (Left Knee)  Patient Location: PACU  Anesthesia Type:General  Level of Consciousness: sedated  Airway & Oxygen Therapy: Patient Spontanous Breathing and Patient connected to face mask oxygen  Post-op Assessment: Report given to RN and Post -op Vital signs reviewed and stable  Post vital signs: Reviewed and stable  Last Vitals:  Vitals Value Taken Time  BP 115/74 11/03/2018  5:34 PM  Temp    Pulse 71 11/03/2018  5:35 PM  Resp 12 11/03/2018  5:35 PM  SpO2 94 % 11/03/2018  5:35 PM  Vitals shown include unvalidated device data.  Last Pain:  Vitals:   11/03/18 1407  TempSrc: Oral  PainSc: 0-No pain         Complications: No apparent anesthesia complications

## 2018-11-03 NOTE — Discharge Instructions (Signed)
°  Instructions after Knee Arthroscopy  ° ° Jacinto P. Tristina Sahagian, Jr., M.D.    ° Dept. of Orthopaedics & Sports Medicine ° [facility] ° [address] ° [place], [place]  [address] ° ° Phone: [phone]   Fax: [phone] ° ° °DIET: °• Drink plenty of non-alcoholic fluids & begin a light diet. °• Resume your normal diet the day after surgery. ° °ACTIVITY:  °• You may use crutches or a walker with weight-bearing as tolerated, unless instructed otherwise. °• You may wean yourself off of the walker or crutches as tolerated.  °• Begin doing gentle exercises. Exercising will reduce the pain and swelling, increase motion, and prevent muscle weakness.   °• Avoid strenuous activities or athletics for a minimum of 4-6 weeks after arthroscopic surgery. °• Do not drive or operate any equipment until instructed. ° °WOUND CARE:  °• Place one to two pillows under the knee the first day or two when sitting or lying.  °• Continue to use the ice packs periodically to reduce pain and swelling. °• The small incisions in your knee are closed with nylon stitches. The stitches will be removed in the office. °• The bulky dressing may be removed on the second day after surgery. DO NOT TOUCH THE STITCHES. Put a Band-Aid over each stitch. Do NOT use any ointments or creams on the incisions.  °• You may bathe or shower after the stitches are removed at the first office visit following surgery. ° °MEDICATIONS: °• You may resume your regular medications. °• Please take the pain medication as prescribed. °• Do not take pain medication on an empty stomach. °• Do not drive or drink alcoholic beverages when taking pain medications. ° °CALL THE OFFICE FOR: °• Temperature above 101 degrees °• Excessive bleeding or drainage on the dressing. °• Excessive swelling, coldness, or paleness of the toes. °• Persistent nausea and vomiting. ° °FOLLOW-UP:  °• You should have an appointment to return to the office in 7-10 days after surgery.  °  °

## 2018-11-03 NOTE — Anesthesia Postprocedure Evaluation (Signed)
Anesthesia Post Note  Patient: AVEER BARTOW  Procedure(s) Performed: ARTHROSCOPY KNEE LEFT MEDIAL MENISCUS TEAR POSTERIOR HORN, PATELLA FEMORAL CHONDROMALACIA (Left Knee)  Patient location during evaluation: PACU Anesthesia Type: General Level of consciousness: awake and alert Pain management: pain level controlled Vital Signs Assessment: post-procedure vital signs reviewed and stable Respiratory status: spontaneous breathing, nonlabored ventilation, respiratory function stable and patient connected to nasal cannula oxygen Cardiovascular status: blood pressure returned to baseline and stable Postop Assessment: no apparent nausea or vomiting Anesthetic complications: no     Last Vitals:  Vitals:   11/03/18 1747 11/03/18 1751  BP:  129/84  Pulse: 76 78  Resp: 14 19  Temp:    SpO2: 99% 94%    Last Pain:  Vitals:   11/03/18 1747  TempSrc:   PainSc: 5                  Molli Barrows

## 2018-11-03 NOTE — Op Note (Signed)
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   ASSISTANT: none  ANESTHESIA: general  ESTIMATED BLOOD LOSS: Minimal  FLUIDS REPLACED: 800 mL of crystalloid  TOURNIQUET TIME: Not used  INDICATIONS FOR SURGERY: Bryan Santiago is a 68 y.o. year old male who has been seen for complaints of left knee pain. MRI demonstrated findings consistent with meniscal pathology. After discussion of the risks and benefits of surgical intervention, the patient expressed understanding of the risks benefits and agree with plans for left knee arthroscopy.   PROCEDURE IN DETAIL: The patient was brought into the operating room and, after adequate general anesthesia was achieved, a tourniquet was applied to the left thigh and the leg was placed in the leg holder. All bony prominences were well padded. The patient's left knee was cleaned and prepped with alcohol and Duraprep and draped in the usual sterile fashion. A "timeout" was performed as per usual protocol. The anticipated portal sites were injected with 0.25% Marcaine with epinephrine. An anterolateral incision was made and a cannula was inserted. A large effusion was evacuated and the knee was distended with fluid using the pump. The scope was advanced down the medial gutter into the medial compartment. Under visualization with the scope, an anteromedial portal was created and a hooked probe was inserted. The medial meniscus was visualized and probed.  There was a large flap type lesion  involving the posterior horn of the medial meniscus.  The tear was debrided using meniscal punches and a 4.5 mm incisor shaver.  Final contouring was performed using the 50 degree ArthroCare wand.  The articular cartilage was visualized.  There were several areas of grade II chondromalacia.  These areas were debrided using the ArthroCare wand.  The scope was then advanced into the intercondylar notch. The anterior cruciate ligament was visualized and probed and felt to be intact. The scope was removed from the lateral portal and reinserted via the anteromedial portal to better visualize the lateral compartment. The lateral meniscus was visualized and probed.  Meniscus demonstrated some mild fraying but no evidence of tear.  The articular cartilage of the lateral compartment was visualized and noted to be in good condition.  Finally, the scope was advanced so as to visualize the patellofemoral articulation. Good patellar tracking was appreciated.  There were grade 2-3 changes of chondromalacia to the patellofemoral articulation.  These areas were debrided using the ArthroCare wand.  The knee was irrigated with copius amounts of fluid and suctioned dry. The anterolateral portal was re-approximated with #3-0 nylon. A combination of 0.25% Marcaine with epinephrine and 4 mg of Morphine were injected via the scope. The scope was removed and the anteromedial portal was re-approximated with #3-0 nylon. A sterile dressing was applied followed by application of an ice wrap.  The patient tolerated the procedure well and was transported to the PACU in stable condition.  Jaidon P. Holley Bouche., M.D.

## 2018-11-03 NOTE — H&P (Signed)
The patient has been re-examined, and the chart reviewed, and there have been no interval changes to the documented history and physical.    The risks, benefits, and alternatives have been discussed at length. The patient expressed understanding of the risks benefits and agreed with plans for surgical intervention.  Faizan P. Hooten, Jr. M.D.    

## 2018-11-03 NOTE — Anesthesia Preprocedure Evaluation (Signed)
Anesthesia Evaluation  Patient identified by MRN, date of birth, ID band Patient awake    Reviewed: Allergy & Precautions, NPO status , Patient's Chart, lab work & pertinent test results  History of Anesthesia Complications Negative for: history of anesthetic complications  Airway Mallampati: II  TM Distance: >3 FB Neck ROM: Full    Dental  (+) Upper Dentures   Pulmonary neg sleep apnea, neg COPD, former smoker,    breath sounds clear to auscultation- rhonchi (-) wheezing      Cardiovascular Exercise Tolerance: Good hypertension, Pt. on medications (-) CAD, (-) Past MI, (-) Cardiac Stents and (-) CABG  Rhythm:Regular Rate:Normal - Systolic murmurs and - Diastolic murmurs    Neuro/Psych neg Seizures negative neurological ROS  negative psych ROS   GI/Hepatic Neg liver ROS, GERD  ,  Endo/Other  negative endocrine ROSneg diabetes  Renal/GU negative Renal ROS     Musculoskeletal  (+) Arthritis ,   Abdominal (+) - obese,   Peds  Hematology negative hematology ROS (+)   Anesthesia Other Findings Past Medical History: No date: Allergic rhinitis No date: Arthritis No date: Cancer ([place])     Comment:  Basal Cell Skin Cancer No date: Complication of anesthesia     Comment:  BP DROPPED WITH LAST KNEE SURGERY No date: GERD (gastroesophageal reflux disease) No date: History of diverticulosis No date: Hyperlipemia No date: Hypertension No date: Vitamin B 12 deficiency   Reproductive/Obstetrics                            Anesthesia Physical Anesthesia Plan  ASA: II  Anesthesia Plan: General   Post-op Pain Management:    Induction: Intravenous  PONV Risk Score and Plan: 1 and Ondansetron and Midazolam  Airway Management Planned: LMA  Additional Equipment:   Intra-op Plan:   Post-operative Plan:   Informed Consent: I have reviewed the patients History and Physical, chart, labs and  discussed the procedure including the risks, benefits and alternatives for the proposed anesthesia with the patient or authorized representative who has indicated his/her understanding and acceptance.     Dental advisory given  Plan Discussed with: CRNA and Anesthesiologist  Anesthesia Plan Comments:         Anesthesia Quick Evaluation

## 2018-11-04 ENCOUNTER — Encounter: Payer: Self-pay | Admitting: Orthopedic Surgery

## 2019-01-02 DIAGNOSIS — M76899 Other specified enthesopathies of unspecified lower limb, excluding foot: Secondary | ICD-10-CM | POA: Diagnosis not present

## 2019-03-03 DIAGNOSIS — Z20828 Contact with and (suspected) exposure to other viral communicable diseases: Secondary | ICD-10-CM | POA: Diagnosis not present

## 2019-03-09 DIAGNOSIS — Z20828 Contact with and (suspected) exposure to other viral communicable diseases: Secondary | ICD-10-CM | POA: Diagnosis not present

## 2019-06-25 DIAGNOSIS — I10 Essential (primary) hypertension: Secondary | ICD-10-CM | POA: Diagnosis not present

## 2019-06-25 DIAGNOSIS — H6982 Other specified disorders of Eustachian tube, left ear: Secondary | ICD-10-CM | POA: Diagnosis not present

## 2019-07-20 ENCOUNTER — Ambulatory Visit: Payer: PPO | Attending: Internal Medicine

## 2019-07-20 ENCOUNTER — Other Ambulatory Visit: Payer: Self-pay

## 2019-07-20 DIAGNOSIS — Z23 Encounter for immunization: Secondary | ICD-10-CM | POA: Insufficient documentation

## 2019-07-20 NOTE — Progress Notes (Signed)
   Covid-[facility] Vaccination [facility]  Name:  CRISTIN THAKER    MRN: [id number] DOB: Sep 27, 1950  07/20/2019  Mr. Munroe was observed post Covid-19 immunization for 15 minutes without incidence. He was provided with Vaccine Information Sheet and instruction to access the V-Safe system.   Mr. Olbrich was instructed to call 911 with any severe reactions post vaccine: Marland Kitchen Difficulty breathing  . Swelling of your face and throat  . A fast heartbeat  . A bad rash all over your body  . Dizziness and weakness    Immunizations Administered    Name Date Dose VIS Date Route   Pfizer COVID-19 Vaccine 07/20/2019 10:24 AM 0.3 mL 05/15/2019 Intramuscular   Manufacturer: [place]   Lot: [id number]   [place]: [id number]

## 2019-08-18 ENCOUNTER — Ambulatory Visit: Payer: PPO | Attending: Internal Medicine

## 2019-08-18 DIAGNOSIS — Z23 Encounter for immunization: Secondary | ICD-10-CM

## 2019-08-18 NOTE — Progress Notes (Signed)
   Covid-19 Vaccination Clinic  Name:  Bryan Santiago    MRN: [id number] DOB: [birth year]  08/18/2019  Mr. Bryan Santiago was observed post Covid-19 immunization for 15 minutes without incident. He was provided with Vaccine Information Sheet and instruction to access the V-Safe system.   Mr. Bryan Santiago was instructed to call 911 with any severe reactions post vaccine: Marland Kitchen Difficulty breathing  . Swelling of face and throat  . A fast heartbeat  . A bad rash all over body  . Dizziness and weakness   Immunizations Administered    Name Date Dose VIS Date Route   Pfizer COVID-19 Vaccine 08/18/2019  9:40 AM 0.3 mL 05/15/2019 Intramuscular   Manufacturer: [place]   Lot: [id number]   [place]: [id number]

## 2019-10-15 DIAGNOSIS — E782 Mixed hyperlipidemia: Secondary | ICD-10-CM | POA: Diagnosis not present

## 2019-10-15 DIAGNOSIS — E538 Deficiency of other specified B group vitamins: Secondary | ICD-10-CM | POA: Diagnosis not present

## 2019-10-15 DIAGNOSIS — Z125 Encounter for screening for malignant neoplasm of prostate: Secondary | ICD-10-CM | POA: Diagnosis not present

## 2019-10-22 DIAGNOSIS — Z125 Encounter for screening for malignant neoplasm of prostate: Secondary | ICD-10-CM | POA: Diagnosis not present

## 2019-10-22 DIAGNOSIS — E538 Deficiency of other specified B group vitamins: Secondary | ICD-10-CM | POA: Diagnosis not present

## 2019-10-22 DIAGNOSIS — Z0000 Encounter for general adult medical examination without abnormal findings: Secondary | ICD-10-CM | POA: Diagnosis not present

## 2019-10-22 DIAGNOSIS — E782 Mixed hyperlipidemia: Secondary | ICD-10-CM | POA: Diagnosis not present

## 2020-02-10 DIAGNOSIS — Z23 Encounter for immunization: Secondary | ICD-10-CM | POA: Diagnosis not present

## 2020-02-10 DIAGNOSIS — I10 Essential (primary) hypertension: Secondary | ICD-10-CM | POA: Diagnosis not present

## 2020-02-10 DIAGNOSIS — R42 Dizziness and giddiness: Secondary | ICD-10-CM | POA: Diagnosis not present

## 2020-04-12 DIAGNOSIS — H6983 Other specified disorders of Eustachian tube, bilateral: Secondary | ICD-10-CM | POA: Diagnosis not present

## 2020-04-12 DIAGNOSIS — J339 Nasal polyp, unspecified: Secondary | ICD-10-CM | POA: Diagnosis not present

## 2020-04-12 DIAGNOSIS — Z23 Encounter for immunization: Secondary | ICD-10-CM | POA: Diagnosis not present

## 2020-04-21 DIAGNOSIS — Z96651 Presence of right artificial knee joint: Secondary | ICD-10-CM | POA: Diagnosis not present

## 2020-04-21 DIAGNOSIS — M25562 Pain in left knee: Secondary | ICD-10-CM | POA: Diagnosis not present

## 2020-04-21 DIAGNOSIS — G8929 Other chronic pain: Secondary | ICD-10-CM | POA: Diagnosis not present

## 2020-05-03 DIAGNOSIS — H90A22 Sensorineural hearing loss, unilateral, left ear, with restricted hearing on the contralateral side: Secondary | ICD-10-CM | POA: Diagnosis not present

## 2020-05-03 DIAGNOSIS — H9319 Tinnitus, unspecified ear: Secondary | ICD-10-CM | POA: Diagnosis not present

## 2020-05-03 DIAGNOSIS — K219 Gastro-esophageal reflux disease without esophagitis: Secondary | ICD-10-CM | POA: Diagnosis not present

## 2020-05-03 DIAGNOSIS — J301 Allergic rhinitis due to pollen: Secondary | ICD-10-CM | POA: Diagnosis not present

## 2020-09-08 IMAGING — MR MRI OF THE LEFT KNEE WITHOUT CONTRAST
2 series · 40 of 40 positions shown · non-contrast
Comparison: None.

CLINICAL DATA: Medial knee pain for the past 2 weeks.  No injury.

EXAM:
MRI OF THE LEFT KNEE WITHOUT CONTRAST
TECHNIQUE: Multiplanar, multisequence MR imaging of the knee was performed. No
intravenous contrast was administered.

[Series 3: T2 fat-sat · axial · 4.0mm · 0.53mm/px · z∈[-109,+61]mm · 22 of 35 slices shown]
[im 1/35]
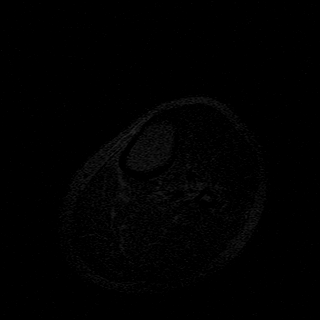
[im 2/35]
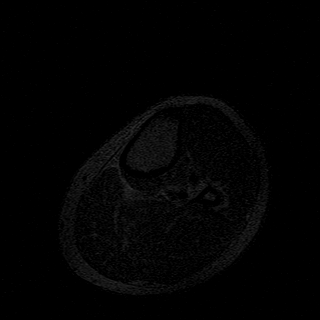
[im 4/35]
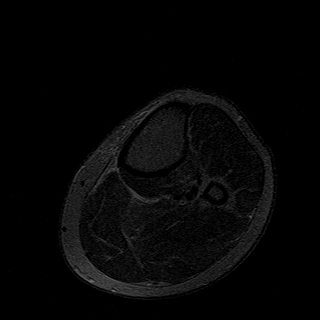
[im 5/35]
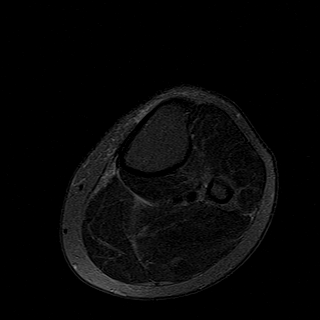
[im 7/35]
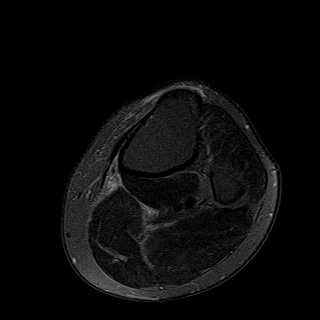
[im 9/35]
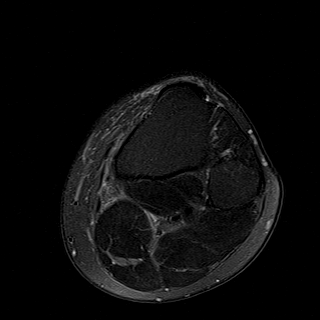
[im 10/35]
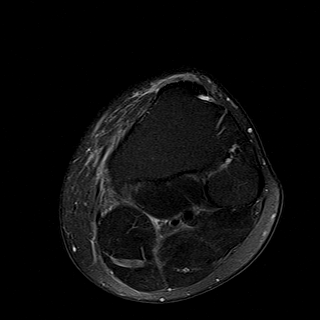
[im 12/35]
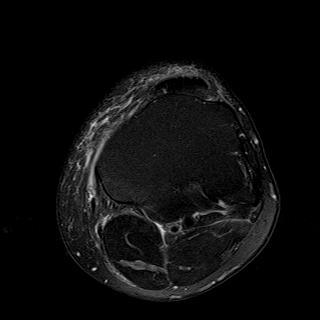
[im 13/35]
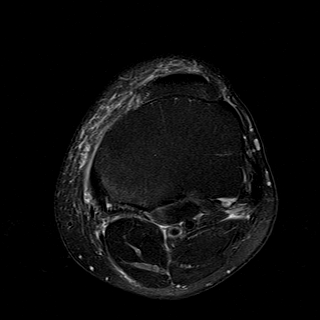
[im 15/35]
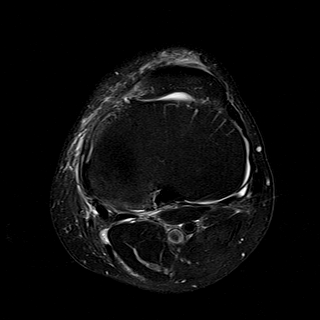
[im 17/35]
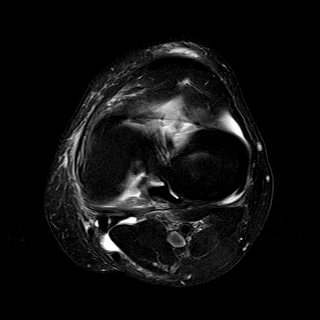
[im 18/35]
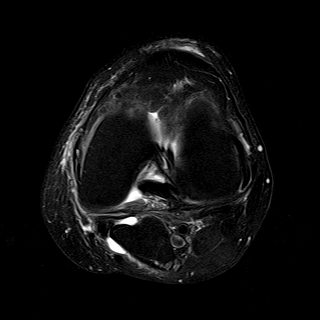
[im 20/35]
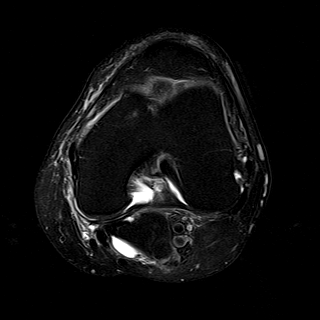
[im 22/35]
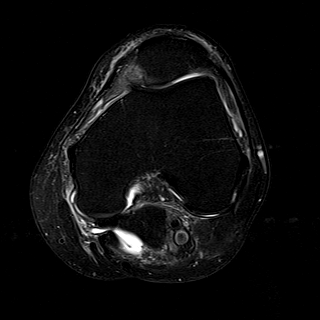
[im 23/35]
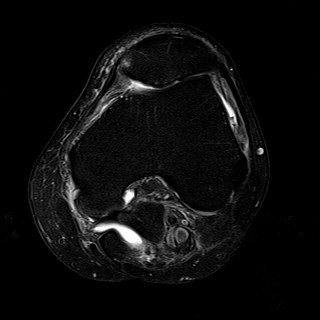
[im 25/35]
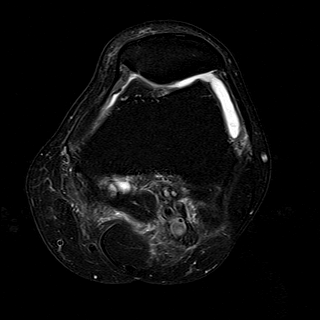
[im 26/35]
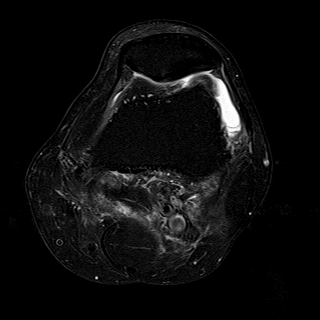
[im 28/35]
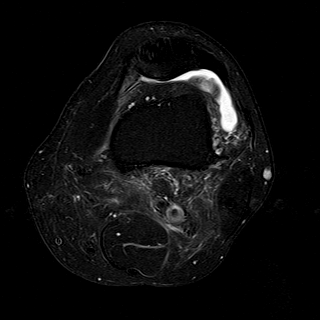
[im 30/35]
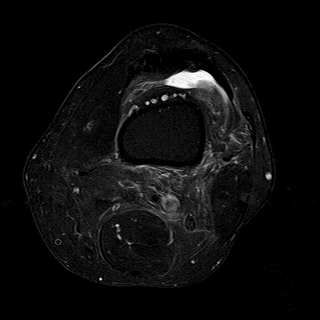
[im 31/35]
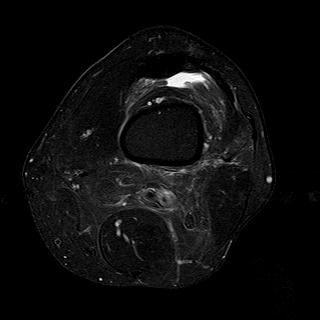
[im 33/35]
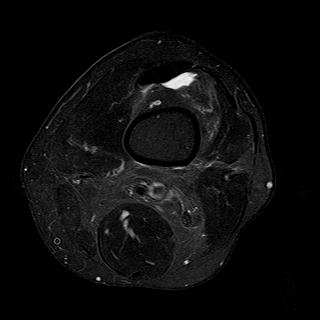
[im 35/35]
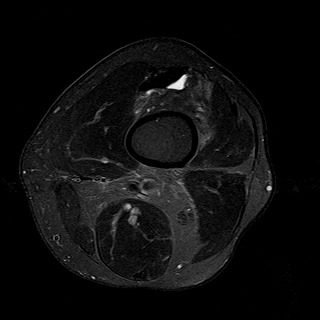

[Series 4: T1 · coronal · 4.0mm · 0.62mm/px · 18 of 30 slices shown]
[im 1/30]
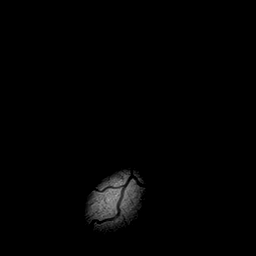
[im 2/30]
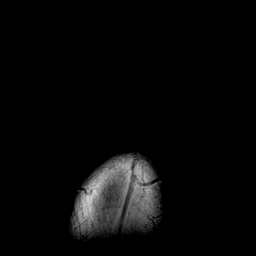
[im 4/30]
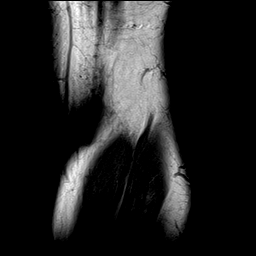
[im 6/30]
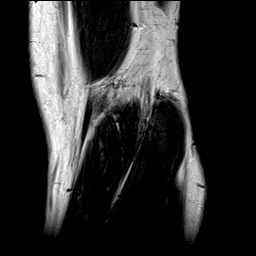
[im 7/30]
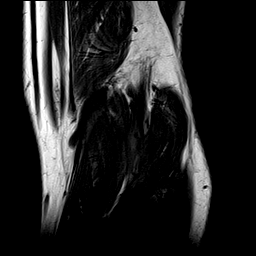
[im 9/30]
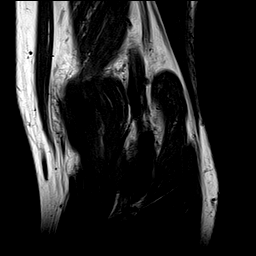
[im 11/30]
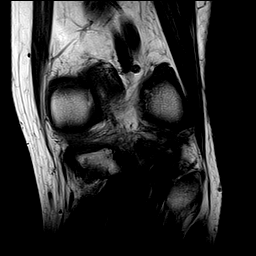
[im 12/30]
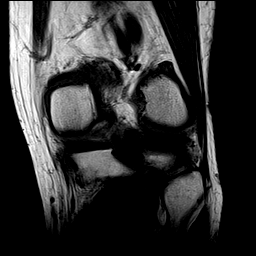
[im 14/30]
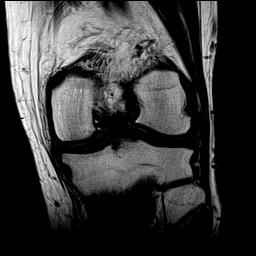
[im 16/30]
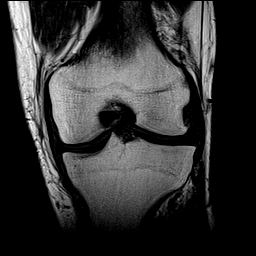
[im 18/30]
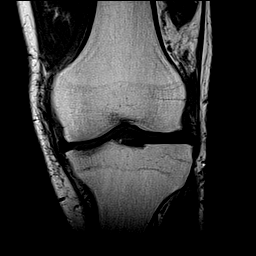
[im 19/30]
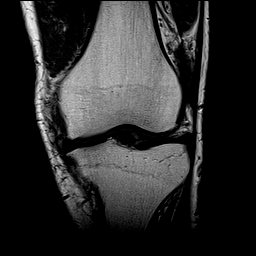
[im 21/30]
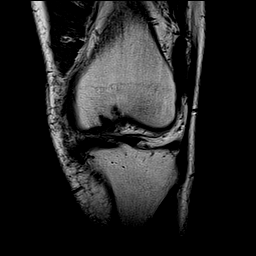
[im 23/30]
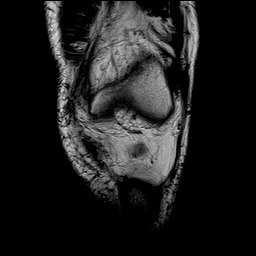
[im 24/30]
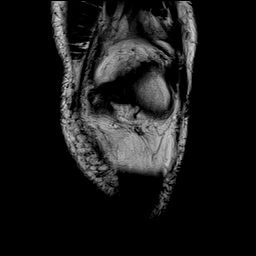
[im 26/30]
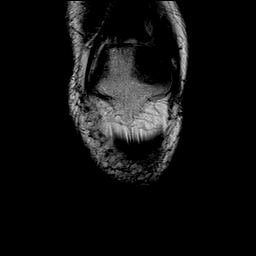
[im 28/30]
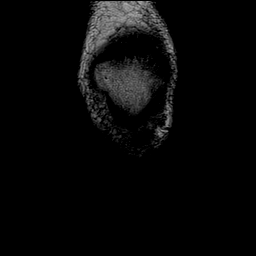
[im 30/30]
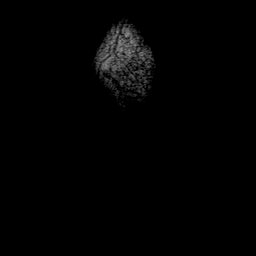

[40 of 40 positions shown; findings below may reference images not displayed]

FINDINGS: MENISCI

Medial meniscus:  Horizontal tear of the posterior body and horn.

Lateral meniscus:  Small radial tear of the body.

LIGAMENTS

Cruciates:  Intact ACL and PCL.

Collaterals: Medial collateral ligament is intact. Mild edema
adjacent to the MCL. Lateral collateral ligament complex is intact.

CARTILAGE

Patellofemoral: Areas of full-thickness cartilage loss over the
medial patellar facet. High-grade partial-thickness cartilage loss
over the trochlear groove and medial trochlea.

Medial: Mild diffuse thinning with focal near full-thickness defect
over the medial aspect of the medial femoral condyle.

Lateral:  Mild diffuse thinning without focal defect.

Joint: Small joint effusion. Edema within Hoffa's fat. No plical
thickening.

Popliteal Fossa:  Small Baker cyst.  Intact popliteus tendon.

Extensor Mechanism: Intact quadriceps tendon and patellar tendon.
Intact medial and lateral patellar retinaculum. Intact MPFL.

Bones:  No acute fracture or dislocation.  No focal bone lesion.

Other: None.
IMPRESSION: 1. Horizontal tear of the medial meniscus posterior body and horn.
2. Small radial tear of the lateral meniscus body.
3. Mild edema tracks adjacent to the MCL. This can be incidental but
in the appropriate clinical circumstance could represent grade 1
sprain.
4. Tricompartmental osteoarthritis.

## 2020-09-15 DIAGNOSIS — I10 Essential (primary) hypertension: Secondary | ICD-10-CM | POA: Diagnosis not present

## 2020-09-15 DIAGNOSIS — M76899 Other specified enthesopathies of unspecified lower limb, excluding foot: Secondary | ICD-10-CM | POA: Diagnosis not present

## 2020-10-19 DIAGNOSIS — E538 Deficiency of other specified B group vitamins: Secondary | ICD-10-CM | POA: Diagnosis not present

## 2020-10-19 DIAGNOSIS — E782 Mixed hyperlipidemia: Secondary | ICD-10-CM | POA: Diagnosis not present

## 2020-10-19 DIAGNOSIS — Z125 Encounter for screening for malignant neoplasm of prostate: Secondary | ICD-10-CM | POA: Diagnosis not present

## 2020-10-26 DIAGNOSIS — D500 Iron deficiency anemia secondary to blood loss (chronic): Secondary | ICD-10-CM | POA: Diagnosis not present

## 2020-10-26 DIAGNOSIS — Z125 Encounter for screening for malignant neoplasm of prostate: Secondary | ICD-10-CM | POA: Diagnosis not present

## 2020-10-26 DIAGNOSIS — E538 Deficiency of other specified B group vitamins: Secondary | ICD-10-CM | POA: Diagnosis not present

## 2020-10-26 DIAGNOSIS — Z0000 Encounter for general adult medical examination without abnormal findings: Secondary | ICD-10-CM | POA: Diagnosis not present

## 2020-10-26 DIAGNOSIS — E782 Mixed hyperlipidemia: Secondary | ICD-10-CM | POA: Diagnosis not present

## 2020-12-16 DIAGNOSIS — D500 Iron deficiency anemia secondary to blood loss (chronic): Secondary | ICD-10-CM | POA: Diagnosis not present

## 2021-03-31 DIAGNOSIS — U099 Post covid-19 condition, unspecified: Secondary | ICD-10-CM | POA: Diagnosis not present

## 2021-03-31 DIAGNOSIS — R0609 Other forms of dyspnea: Secondary | ICD-10-CM | POA: Diagnosis not present

## 2021-03-31 DIAGNOSIS — E538 Deficiency of other specified B group vitamins: Secondary | ICD-10-CM | POA: Diagnosis not present

## 2021-03-31 DIAGNOSIS — R0600 Dyspnea, unspecified: Secondary | ICD-10-CM | POA: Diagnosis not present

## 2021-03-31 DIAGNOSIS — J40 Bronchitis, not specified as acute or chronic: Secondary | ICD-10-CM | POA: Diagnosis not present

## 2021-11-08 DIAGNOSIS — Z125 Encounter for screening for malignant neoplasm of prostate: Secondary | ICD-10-CM | POA: Diagnosis not present

## 2021-11-08 DIAGNOSIS — E538 Deficiency of other specified B group vitamins: Secondary | ICD-10-CM | POA: Diagnosis not present

## 2021-11-08 DIAGNOSIS — E782 Mixed hyperlipidemia: Secondary | ICD-10-CM | POA: Diagnosis not present

## 2021-11-15 DIAGNOSIS — E782 Mixed hyperlipidemia: Secondary | ICD-10-CM | POA: Diagnosis not present

## 2021-11-15 DIAGNOSIS — Z0000 Encounter for general adult medical examination without abnormal findings: Secondary | ICD-10-CM | POA: Diagnosis not present

## 2021-11-15 DIAGNOSIS — E538 Deficiency of other specified B group vitamins: Secondary | ICD-10-CM | POA: Diagnosis not present

## 2021-11-15 DIAGNOSIS — Z125 Encounter for screening for malignant neoplasm of prostate: Secondary | ICD-10-CM | POA: Diagnosis not present

## 2022-04-12 DIAGNOSIS — Z96651 Presence of right artificial knee joint: Secondary | ICD-10-CM | POA: Diagnosis not present

## 2022-04-12 DIAGNOSIS — M1712 Unilateral primary osteoarthritis, left knee: Secondary | ICD-10-CM | POA: Diagnosis not present

## 2022-12-05 DIAGNOSIS — E785 Hyperlipidemia, unspecified: Secondary | ICD-10-CM | POA: Diagnosis not present

## 2022-12-05 DIAGNOSIS — Z0000 Encounter for general adult medical examination without abnormal findings: Secondary | ICD-10-CM | POA: Diagnosis not present

## 2022-12-05 DIAGNOSIS — E538 Deficiency of other specified B group vitamins: Secondary | ICD-10-CM | POA: Diagnosis not present

## 2022-12-05 DIAGNOSIS — Z1331 Encounter for screening for depression: Secondary | ICD-10-CM | POA: Diagnosis not present

## 2022-12-05 DIAGNOSIS — E782 Mixed hyperlipidemia: Secondary | ICD-10-CM | POA: Diagnosis not present

## 2022-12-05 DIAGNOSIS — Z125 Encounter for screening for malignant neoplasm of prostate: Secondary | ICD-10-CM | POA: Diagnosis not present
